# Patient Record
Sex: Male | Born: 1985
Health system: Southern US, Community
[De-identification: ages and names within clinical notes are randomized; demographics above are authoritative.]

## PROBLEM LIST (undated history)

## (undated) HISTORY — PX: APPENDECTOMY: SHX54

## (undated) HISTORY — PX: MOUTH SURGERY: SHX715

---

## 2013-08-05 ENCOUNTER — Encounter (INDEPENDENT_AMBULATORY_CARE_PROVIDER_SITE_OTHER): Payer: Self-pay

## 2013-08-05 ENCOUNTER — Encounter: Payer: Self-pay | Admitting: Family Medicine

## 2013-08-05 ENCOUNTER — Ambulatory Visit (INDEPENDENT_AMBULATORY_CARE_PROVIDER_SITE_OTHER): Payer: 59 | Admitting: Family Medicine

## 2013-08-05 VITALS — BP 132/85 | HR 49 | Temp 98.0°F | Ht 74.0 in | Wt 259.0 lb

## 2013-08-05 DIAGNOSIS — J3489 Other specified disorders of nose and nasal sinuses: Secondary | ICD-10-CM

## 2013-08-05 DIAGNOSIS — R509 Fever, unspecified: Secondary | ICD-10-CM

## 2013-08-05 DIAGNOSIS — J069 Acute upper respiratory infection, unspecified: Secondary | ICD-10-CM

## 2013-08-05 DIAGNOSIS — R0981 Nasal congestion: Secondary | ICD-10-CM

## 2013-08-05 DIAGNOSIS — R05 Cough: Secondary | ICD-10-CM

## 2013-08-05 DIAGNOSIS — R059 Cough, unspecified: Secondary | ICD-10-CM

## 2013-08-05 LAB — POCT INFLUENZA A/B
Influenza A, POC: NEGATIVE
Influenza B, POC: NEGATIVE

## 2013-08-05 MED ORDER — TRIAMCINOLONE ACETONIDE 40 MG/ML IJ SUSP: 60.0000 mg | Freq: Once | INTRAMUSCULAR | Status: DC

## 2013-08-05 MED ORDER — AZITHROMYCIN 250 MG PO TABS: ORAL_TABLET | ORAL | Status: DC

## 2013-08-05 NOTE — Progress Notes (Signed)
   Subjective:    Patient ID: Quanta Roher, male    DOB: Jun 15, 1985, 28 y.o.   MRN: [id number]  HPI  This 28 y.o. male presents for evaluation of URI for a week.  Review of Systems No chest pain, SOB, HA, dizziness, vision change, N/V, diarrhea, constipation, dysuria, urinary urgency or frequency, myalgias, arthralgias or rash.     Objective:   Physical Exam Vital signs noted  Well developed well nourished male.  HEENT - Head atraumatic Normocephalic                Eyes - PERRLA, Conjuctiva - clear Sclera- Clear EOMI                Ears - EAC's Wnl TM's Wnl Gross Hearing WNL                Nose - Nares patent                 Throat - oropharanx wnl Respiratory - Lungs CTA bilateral Cardiac - RRR S1 and S2 without murmur GI - Abdomen soft Nontender and bowel sounds active x 4 Extremities - No edema. Neuro - Grossly intact.       Assessment & Plan:  Cough - Plan: POCT Influenza A/B  Nasal congestion - Plan: POCT Influenza A/B  Fever - Plan: POCT Influenza A/B  URI (upper respiratory infection) - Plan: azithromycin (ZITHROMAX) 250 MG tablet, triamcinolone acetonide (KENALOG-40) injection 60 mg  Push po fluids, rest, tylenol and motrin otc prn as directed for fever, arthralgias, and myalgias.  Follow up prn if sx's continue or persist.  Lysbeth Penner FNP

## 2013-09-30 ENCOUNTER — Ambulatory Visit (INDEPENDENT_AMBULATORY_CARE_PROVIDER_SITE_OTHER): Payer: 59 | Admitting: Nurse Practitioner

## 2013-09-30 VITALS — BP 123/77 | HR 53 | Temp 97.3°F | Ht 75.0 in | Wt 254.0 lb

## 2013-09-30 DIAGNOSIS — J309 Allergic rhinitis, unspecified: Secondary | ICD-10-CM

## 2013-09-30 DIAGNOSIS — J029 Acute pharyngitis, unspecified: Secondary | ICD-10-CM

## 2013-09-30 DIAGNOSIS — J019 Acute sinusitis, unspecified: Secondary | ICD-10-CM

## 2013-09-30 LAB — POCT RAPID STREP A (OFFICE): Rapid Strep A Screen: NEGATIVE

## 2013-09-30 MED ORDER — AZITHROMYCIN 250 MG PO TABS: ORAL_TABLET | ORAL | Status: DC

## 2013-09-30 MED ORDER — CICLESONIDE 50 MCG/ACT NA SUSP: 2.0000 | Freq: Every day | NASAL | Status: DC

## 2013-09-30 NOTE — Patient Instructions (Signed)
Allergic Rhinitis Allergic rhinitis is when the mucous membranes in the nose respond to allergens. Allergens are particles in the air that cause your body to have an allergic reaction. This causes you to release allergic antibodies. Through a chain of events, these eventually cause you to release histamine into the blood stream. Although meant to protect the body, it is this release of histamine that causes your discomfort, such as frequent sneezing, congestion, and an itchy, runny nose.  CAUSES  Seasonal allergic rhinitis (hay fever) is caused by pollen allergens that may come from grasses, trees, and weeds. Year-round allergic rhinitis (perennial allergic rhinitis) is caused by allergens such as house dust mites, pet dander, and mold spores.  SYMPTOMS   Nasal stuffiness (congestion).  Itchy, runny nose with sneezing and tearing of the eyes. DIAGNOSIS  Your health care provider can help you determine the allergen or allergens that trigger your symptoms. If you and your health care provider are unable to determine the allergen, skin or blood testing may be used. TREATMENT  Allergic Rhinitis does not have a cure, but it can be controlled by:  Medicines and allergy shots (immunotherapy).  Avoiding the allergen. Hay fever may often be treated with antihistamines in pill or nasal spray forms. Antihistamines block the effects of histamine. There are over-the-counter medicines that may help with nasal congestion and swelling around the eyes. Check with your health care provider before taking or giving this medicine.  If avoiding the allergen or the medicine prescribed do not work, there are many new medicines your health care provider can prescribe. Stronger medicine may be used if initial measures are ineffective. Desensitizing injections can be used if medicine and avoidance does not work. Desensitization is when a patient is given ongoing shots until the body becomes less sensitive to the allergen.  Make sure you follow up with your health care provider if problems continue. HOME CARE INSTRUCTIONS It is not possible to completely avoid allergens, but you can reduce your symptoms by taking steps to limit your exposure to them. It helps to know exactly what you are allergic to so that you can avoid your specific triggers. SEEK MEDICAL CARE IF:   You have a fever.  You develop a cough that does not stop easily (persistent).  You have shortness of breath.  You start wheezing.  Symptoms interfere with normal daily activities. Document Released: 02/07/2001 Document Revised: 03/05/2013 Document Reviewed: 01/20/2013 [employer] Patient Information 2014 [employer].  

## 2013-09-30 NOTE — Progress Notes (Signed)
   Subjective:    Patient ID: Brett Aguirre, male    DOB: August 04, 1985, 28 y.o.   MRN: [id number]  HPI Patient in today c/o sore throat- started this morning- was a little scratchy last night- Does have nasal congestion and drainage.    Review of Systems  Constitutional: Negative for fever and chills.  HENT: Positive for congestion, postnasal drip, rhinorrhea and sore throat. Negative for trouble swallowing and voice change.   Respiratory: Negative.  Negative for cough.   Cardiovascular: Negative.   Genitourinary: Negative.   Psychiatric/Behavioral: Negative.   All other systems reviewed and are negative.      Objective:   Physical Exam  Constitutional: He is oriented to person, place, and time. He appears well-developed and well-nourished.  HENT:  Right Ear: Hearing, tympanic membrane, external ear and ear canal normal.  Left Ear: Hearing, tympanic membrane, external ear and ear canal normal.  Nose: Mucosal edema and rhinorrhea present. Right sinus exhibits maxillary sinus tenderness. Right sinus exhibits no frontal sinus tenderness. Left sinus exhibits maxillary sinus tenderness. Left sinus exhibits no frontal sinus tenderness.  Mouth/Throat: Posterior oropharyngeal erythema (mild) present.  Eyes: Pupils are equal, round, and reactive to light.  Neck: Normal range of motion. Neck supple.  Cardiovascular: Normal rate, regular rhythm and normal heart sounds.   Pulmonary/Chest: Effort normal and breath sounds normal. No respiratory distress. He has no wheezes. He has no rales. He exhibits no tenderness.  Abdominal: Soft.  Lymphadenopathy:    He has no cervical adenopathy.  Neurological: He is alert and oriented to person, place, and time.  Skin: Skin is warm.  Psychiatric: He has a normal mood and affect. His behavior is normal. Judgment and thought content normal.   BP 123/77  Pulse 53  Temp(Src) 97.3 F (36.3 C) (Oral)  Ht 6\' 3"  (1.905 m)  Wt 254 lb (115.214 kg)  BMI 31.75  kg/m2         Assessment & Plan:  1. Sore throat - POCT rapid strep A  2. Acute pharyngitis Force fluids Throat lozgenses  3. Acute rhinosinusitis 1. Take meds as prescribed 2. Use a cool mist humidifier especially during the winter months and when heat has been humid. 3. Use saline nose sprays frequently 4. Saline irrigations of the nose can be very helpful if done frequently.  * 4X daily for 1 week*  * Use of a nettie pot can be helpful with this. Follow directions with this* 5. Drink plenty of fluids 6. Keep thermostat turn down low 7.For any cough or congestion  Use plain Mucinex- regular strength or max strength is fine   * Children- consult with Pharmacist for dosing 8. For fever or aces or pains- take tylenol or ibuprofen appropriate for age and weight.  * for fevers greater than 101 orally you may alternate ibuprofen and tylenol every  3 hours.    - azithromycin (ZITHROMAX Z-PAK) 250 MG tablet; As directed  Dispense: 6 each; Refill: 0  4. Allergic rhinitis - ciclesonide (OMNARIS) 50 MCG/ACT nasal spray; Place 2 sprays into both nostrils daily.  Dispense: 12.5 g; Refill: 5   Mary-Margaret Hassell Done, FNP

## 2013-10-02 ENCOUNTER — Telehealth: Payer: Self-pay | Admitting: *Deleted

## 2013-10-02 NOTE — Telephone Encounter (Signed)
Has to try flonase OTC before ins will pay for omnaris

## 2013-10-02 NOTE — Telephone Encounter (Signed)
Needs to try fluticasone or nasonex first.before ins will cover omnaris.

## 2013-10-03 ENCOUNTER — Telehealth: Payer: Self-pay | Admitting: Nurse Practitioner

## 2013-10-03 NOTE — Telephone Encounter (Signed)
In another encounter 

## 2013-10-06 ENCOUNTER — Telehealth: Payer: Self-pay | Admitting: Nurse Practitioner

## 2013-10-06 NOTE — Telephone Encounter (Signed)
Aware , does not get a refill on antibiotic. Wait for medication to work.

## 2013-10-06 NOTE — Telephone Encounter (Signed)
We do not do refills on antibiotics- z pak u take for 5 days but stays in your system for 10 days.

## 2013-10-28 ENCOUNTER — Telehealth: Payer: Self-pay | Admitting: Nurse Practitioner

## 2013-10-28 ENCOUNTER — Encounter: Payer: Self-pay | Admitting: Nurse Practitioner

## 2013-10-28 ENCOUNTER — Ambulatory Visit (INDEPENDENT_AMBULATORY_CARE_PROVIDER_SITE_OTHER): Payer: 59 | Admitting: Nurse Practitioner

## 2013-10-28 VITALS — BP 129/85 | HR 83 | Temp 98.7°F | Ht 75.0 in | Wt 252.0 lb

## 2013-10-28 DIAGNOSIS — B9689 Other specified bacterial agents as the cause of diseases classified elsewhere: Secondary | ICD-10-CM

## 2013-10-28 DIAGNOSIS — J019 Acute sinusitis, unspecified: Secondary | ICD-10-CM

## 2013-10-28 MED ORDER — AZITHROMYCIN 250 MG PO TABS: ORAL_TABLET | ORAL | Status: DC

## 2013-10-28 MED ORDER — CHLORPHEN-PE-ACETAMINOPHEN 4-10-325 MG PO TABS: 1.0000 | ORAL_TABLET | Freq: Two times a day (BID) | ORAL | Status: DC

## 2013-10-28 MED ORDER — METHYLPREDNISOLONE ACETATE 80 MG/ML IJ SUSP
80.0000 mg | Freq: Once | INTRAMUSCULAR | Status: AC
  Administered 2013-10-28: 80 mg via INTRAMUSCULAR

## 2013-10-28 NOTE — Progress Notes (Signed)
   Subjective:    Patient ID: Brett Aguirre, male    DOB: 05/12/1986, 28 y.o.   MRN: [id number]  HPI atient in today c/o congestion and body aches- Chills then gets hot- green phlegm    Review of Systems  Constitutional: Positive for chills and fatigue.  HENT: Positive for congestion and rhinorrhea.   Respiratory: Negative for cough.   Cardiovascular: Negative.   Gastrointestinal: Negative.   Genitourinary: Negative.   Neurological: Negative.   Psychiatric/Behavioral: Negative.        Objective:   Physical Exam  Constitutional: He is oriented to person, place, and time. He appears well-developed and well-nourished. No distress.  HENT:  Right Ear: Hearing, tympanic membrane, external ear and ear canal normal.  Left Ear: Hearing, tympanic membrane, external ear and ear canal normal.  Nose: Mucosal edema and rhinorrhea present. Right sinus exhibits maxillary sinus tenderness and frontal sinus tenderness. Left sinus exhibits maxillary sinus tenderness and frontal sinus tenderness.  Mouth/Throat: Uvula is midline, oropharynx is clear and moist and mucous membranes are normal.  Eyes: Pupils are equal, round, and reactive to light.  Neck: Normal range of motion. Neck supple.  Cardiovascular: Normal rate, regular rhythm and normal heart sounds.   Pulmonary/Chest: Effort normal and breath sounds normal.  Lymphadenopathy:    He has no cervical adenopathy.  Neurological: He is alert and oriented to person, place, and time.  Skin: Skin is warm.  Face flushed  BP 129/85  Pulse 83  Temp(Src) 98.7 F (37.1 C) (Oral)  Ht 6\' 3"  (1.905 m)  Wt 252 lb (114.306 kg)  BMI 31.50 kg/m2         Assessment & Plan:   1. Acute bacterial rhinosinusitis    Meds ordered this encounter  Medications  . azithromycin (ZITHROMAX Z-PAK) 250 MG tablet    Sig: As directed    Dispense:  6 each    Refill:  0    Order Specific Question:  Supervising Provider    Answer:  Chipper Herb [[id number]]  .  methylPREDNISolone acetate (DEPO-MEDROL) injection 80 mg    Sig:   . Chlorphen-PE-Acetaminophen 4-10-325 MG TABS    Sig: Take 1 each by mouth 2 (two) times daily.    Dispense:  20 tablet    Refill:  0    Order Specific Question:  Supervising Provider    Answer:  Chipper Herb [[id number]]   1. Take meds as prescribed 2. Use a cool mist humidifier especially during the winter months and when heat has been humid. 3. Use saline nose sprays frequently 4. Saline irrigations of the nose can be very helpful if done frequently.  * 4X daily for 1 week*  * Use of a nettie pot can be helpful with this. Follow directions with this* 5. Drink plenty of fluids 6. Keep thermostat turn down low 7.For any cough or congestion  Use plain Mucinex- regular strength or max strength is fine   * Children- consult with Pharmacist for dosing 8. For fever or aces or pains- take tylenol or ibuprofen appropriate for age and weight.  * for fevers greater than 101 orally you may alternate ibuprofen and tylenol every  3 hours.   Mary-Margaret Hassell Done, FNP

## 2013-10-28 NOTE — Telephone Encounter (Signed)
appt at 4:30 with [facility]

## 2013-10-28 NOTE — Patient Instructions (Signed)

## 2014-05-20 ENCOUNTER — Telehealth: Payer: Self-pay | Admitting: Nurse Practitioner

## 2014-05-20 NOTE — Telephone Encounter (Signed)
No prior  notes or medications for cold sores.

## 2014-05-21 MED ORDER — VALACYCLOVIR HCL 1 G PO TABS: ORAL_TABLET | ORAL | Status: DC

## 2014-05-21 NOTE — Telephone Encounter (Signed)
Valtrex sent to pharmacy- patient aware

## 2014-12-08 ENCOUNTER — Ambulatory Visit (INDEPENDENT_AMBULATORY_CARE_PROVIDER_SITE_OTHER): Payer: 59 | Admitting: Family

## 2014-12-08 ENCOUNTER — Encounter: Payer: Self-pay | Admitting: Family

## 2014-12-08 ENCOUNTER — Encounter (INDEPENDENT_AMBULATORY_CARE_PROVIDER_SITE_OTHER): Payer: Self-pay

## 2014-12-08 VITALS — BP 131/88 | HR 72 | Temp 97.3°F | Ht 75.0 in | Wt 255.0 lb

## 2014-12-08 DIAGNOSIS — L259 Unspecified contact dermatitis, unspecified cause: Secondary | ICD-10-CM

## 2014-12-08 MED ORDER — TRIAMCINOLONE ACETONIDE 0.025 % EX OINT: 1.0000 "application " | TOPICAL_OINTMENT | Freq: Two times a day (BID) | CUTANEOUS | Status: DC

## 2014-12-08 MED ORDER — METHYLPREDNISOLONE ACETATE 80 MG/ML IJ SUSP
80.0000 mg | Freq: Once | INTRAMUSCULAR | Status: AC
  Administered 2014-12-08: 80 mg via INTRAMUSCULAR

## 2014-12-08 NOTE — Progress Notes (Signed)
   Subjective:    Patient ID: Brett Aguirre, male    DOB: [birth year], 29 y.o.   MRN: [id number]  Rash This is a new problem. The current episode started yesterday. The problem has been waxing and waning since onset. The affected locations include the groin and genitalia. The rash is characterized by itchiness and burning. He was exposed to plant contact. Pertinent negatives include no congestion, cough, diarrhea, fever, joint pain, shortness of breath or sore throat. Past treatments include anti-itch cream. The treatment provided no relief.      Review of Systems  Constitutional: Negative.  Negative for fever.  HENT: Negative.  Negative for congestion and sore throat.   Respiratory: Negative.  Negative for cough and shortness of breath.   Cardiovascular: Negative.   Gastrointestinal: Negative.  Negative for diarrhea.  Endocrine: Negative.   Genitourinary: Negative.   Musculoskeletal: Negative.  Negative for joint pain.  Skin: Positive for rash.  Neurological: Negative.   Hematological: Negative.   Psychiatric/Behavioral: Negative.   All other systems reviewed and are negative.      Objective:   Physical Exam  Constitutional: He is oriented to person, place, and time. He appears well-developed and well-nourished. No distress.  HENT:  Head: Normocephalic.  Eyes: Pupils are equal, round, and reactive to light. Right eye exhibits no discharge. Left eye exhibits no discharge.  Neck: Normal range of motion. Neck supple. No thyromegaly present.  Cardiovascular: Normal rate, regular rhythm, normal heart sounds and intact distal pulses.   No murmur heard. Pulmonary/Chest: Effort normal and breath sounds normal. No respiratory distress. He has no wheezes.  Abdominal: Soft. Bowel sounds are normal. He exhibits no distension. There is no tenderness.  Musculoskeletal: Normal range of motion. He exhibits no edema or tenderness.  Neurological: He is alert and oriented to person, place, and  time. He has normal reflexes. No cranial nerve deficit.  Skin: Skin is warm and dry. Rash (groin and scrotum) noted. No erythema.  Psychiatric: He has a normal mood and affect. His behavior is normal. Judgment and thought content normal.  Vitals reviewed.   BP 131/88 mmHg  Pulse 72  Temp(Src) 97.3 F (36.3 C) (Oral)  Ht 6\' 3"  (1.905 m)  Wt 255 lb (115.667 kg)  BMI 31.87 kg/m2      Assessment & Plan:  1. Contact dermatitis -Do scratch -Wear protective clothing while outside -RTO prn - triamcinolone (KENALOG) 0.025 % ointment; Apply 1 application topically 2 (two) times daily.  Dispense: 30 g; Refill: 0 - methylPREDNISolone acetate (DEPO-MEDROL) injection 80 mg; Inject 1 mL (80 mg total) into the muscle once.  Evelina Dun, FNP

## 2014-12-08 NOTE — Patient Instructions (Signed)

## 2014-12-15 ENCOUNTER — Ambulatory Visit (INDEPENDENT_AMBULATORY_CARE_PROVIDER_SITE_OTHER): Payer: 59 | Admitting: Family

## 2014-12-15 ENCOUNTER — Encounter: Payer: Self-pay | Admitting: Family

## 2014-12-15 VITALS — BP 119/76 | HR 45 | Temp 97.4°F | Ht 75.0 in | Wt 257.8 lb

## 2014-12-15 DIAGNOSIS — R197 Diarrhea, unspecified: Secondary | ICD-10-CM | POA: Diagnosis not present

## 2014-12-15 LAB — POCT CBC
GRANULOCYTE PERCENT: 59.9 % (ref 37–80)
HCT, POC: 46.3 % (ref 43.5–53.7)
Hemoglobin: 15.7 g/dL (ref 14.1–18.1)
LYMPH, POC: 3.1 (ref 0.6–3.4)
MCH: 29.4 pg (ref 27–31.2)
MCHC: 33.9 g/dL (ref 31.8–35.4)
MCV: 86.7 fL (ref 80–97)
MPV: 7.4 fL (ref 0–99.8)
PLATELET COUNT, POC: 275 10*3/uL (ref 142–424)
POC GRANULOCYTE: 5.5 (ref 2–6.9)
POC LYMPH PERCENT: 34.3 %L (ref 10–50)
RBC: 5.34 M/uL (ref 4.69–6.13)
RDW, POC: 12.8 %
WBC: 9.1 10*3/uL (ref 4.6–10.2)

## 2014-12-15 NOTE — Progress Notes (Signed)
   Subjective:    Patient ID: Brett Aguirre, male    DOB: 1986/01/05, 29 y.o.   MRN: [id number]  Diarrhea  This is a new problem. The current episode started in the past 7 days (Friday). The problem occurs 5 to 10 times per day. The problem has been unchanged. The stool consistency is described as watery. The patient states that diarrhea awakens him from sleep. Associated symptoms include increased flatus. Pertinent negatives include no bloating, chills, coughing, fever, headaches, myalgias, sweats or vomiting. There are no known risk factors. He has tried nothing for the symptoms. The treatment provided no relief.   *Pt has history of C diff- Pt states about 5 years ago.   Review of Systems  Constitutional: Negative.  Negative for fever and chills.  HENT: Negative.   Respiratory: Negative.  Negative for cough.   Cardiovascular: Negative.   Gastrointestinal: Positive for diarrhea and flatus. Negative for vomiting and bloating.  Endocrine: Negative.   Genitourinary: Negative.   Musculoskeletal: Negative.  Negative for myalgias.  Neurological: Negative.  Negative for headaches.  Hematological: Negative.   Psychiatric/Behavioral: Negative.   All other systems reviewed and are negative.      Objective:   Physical Exam  Constitutional: He is oriented to person, place, and time. He appears well-developed and well-nourished. No distress.  HENT:  Head: Normocephalic.  Right Ear: External ear normal.  Left Ear: External ear normal.  Nose: Nose normal.  Mouth/Throat: Oropharynx is clear and moist.  Eyes: Pupils are equal, round, and reactive to light. Right eye exhibits no discharge. Left eye exhibits no discharge.  Neck: Normal range of motion. Neck supple. No thyromegaly present.  Cardiovascular: Normal rate, regular rhythm, normal heart sounds and intact distal pulses.   No murmur heard. Pulmonary/Chest: Effort normal and breath sounds normal. No respiratory distress. He has no  wheezes.  Abdominal: Soft. Bowel sounds are normal. He exhibits no distension. There is no tenderness.  Musculoskeletal: Normal range of motion. He exhibits no edema or tenderness.  Neurological: He is alert and oriented to person, place, and time. He has normal reflexes. No cranial nerve deficit.  Skin: Skin is warm and dry. No rash noted. No erythema.  Psychiatric: He has a normal mood and affect. His behavior is normal. Judgment and thought content normal.  Vitals reviewed.    BP 119/76 mmHg  Pulse 45  Temp(Src) 97.4 F (36.3 C) (Oral)  Ht 6\' 3"  (1.905 m)  Wt 257 lb 12.8 oz (116.937 kg)  BMI 32.22 kg/m2      Assessment & Plan:  1. Diarrhea -Force fluids -Bring in stool sample -After sample- Imodium as needed -RTO prn - Cdiff NAA+O+P+Stool Culture - POCT CBC  Evelina Dun, FNP

## 2014-12-15 NOTE — Patient Instructions (Signed)

## 2014-12-16 ENCOUNTER — Other Ambulatory Visit: Payer: 59

## 2014-12-16 ENCOUNTER — Telehealth: Payer: Self-pay | Admitting: *Deleted

## 2014-12-16 NOTE — Telephone Encounter (Signed)
-----   Message from Sharion Balloon, [place] sent at 12/15/2014  4:33 PM EDT ----- CBC- WNL

## 2014-12-16 NOTE — Telephone Encounter (Signed)
Normal lab result

## 2014-12-21 LAB — CDIFF NAA+O+P+STOOL CULTURE
E coli, Shiga toxin Assay: NEGATIVE
Toxigenic C. Difficile by PCR: NEGATIVE

## 2015-06-09 ENCOUNTER — Ambulatory Visit (INDEPENDENT_AMBULATORY_CARE_PROVIDER_SITE_OTHER): Payer: 59 | Admitting: Family Medicine

## 2015-06-09 ENCOUNTER — Encounter: Payer: Self-pay | Admitting: Family Medicine

## 2015-06-09 VITALS — BP 124/67 | HR 62 | Temp 97.0°F | Ht 75.0 in | Wt 258.0 lb

## 2015-06-09 DIAGNOSIS — H6123 Impacted cerumen, bilateral: Secondary | ICD-10-CM | POA: Diagnosis not present

## 2015-06-09 DIAGNOSIS — J069 Acute upper respiratory infection, unspecified: Secondary | ICD-10-CM | POA: Diagnosis not present

## 2015-06-09 NOTE — Patient Instructions (Signed)
Earwax removal:  Debrox drops are available without a prescription at your pharmacy.  Lay on your side with the ear up that you want to treat. Place for 5 drops of the Debrox in the ear canal and lay still for 15 minutes. After that time you considered up and allow the excess to run out of the year and catch it with a Kleenex. Repeat this with the other ear if needed.  Repeat this process daily for 1 week. By that time the ear should feel less clogged and her hearing should be better, if not, follow up in the office for recheck of the ear.  To prevent future problems, try using the drops once a week for maintenance.  Thanks, [employer]

## 2015-06-09 NOTE — Progress Notes (Signed)
Subjective:  Patient ID: Brett Aguirre, male    DOB: Sep 18, 1985  Age: 30 y.o. MRN: [id number]  CC: Cerumen Impaction   HPI Brett Aguirre presents for ears stopped up. Had a cold starting 2 weeks ago.Has been cleaning ears with a q tip but just around the opening  History Brett Aguirre has no past medical history on file.   He has past surgical history that includes Appendectomy and Mouth surgery.   His family history includes Healthy in his brother and mother; Hyperlipidemia in his father.He reports that he has never smoked. He does not have any smokeless tobacco history on file. He reports that he drinks alcohol. He reports that he does not use illicit drugs.  No current Medications.  ROS Review of Systems  Constitutional: Negative for fever, chills, activity change and appetite change.  HENT: Positive for congestion, ear discharge (flecks of wax;) and ear pain. Negative for hearing loss, nosebleeds, postnasal drip, rhinorrhea, sinus pressure, sneezing and trouble swallowing.   Respiratory: Negative for cough, chest tightness and shortness of breath.   Cardiovascular: Negative for chest pain and palpitations.  Skin: Negative for rash.    Objective:  BP 124/67 mmHg  Pulse 62  Temp(Src) 97 F (36.1 C) (Oral)  Ht 6\' 3"  (1.905 m)  Wt 258 lb (117.028 kg)  BMI 32.25 kg/m2  SpO2 97%  Physical Exam  Constitutional: He appears well-developed and well-nourished.  HENT:  Head: Normocephalic and atraumatic.  Right Ear: External ear normal. Tympanic membrane is not injected, not perforated and not erythematous. No decreased hearing is noted.  Left Ear: Tympanic membrane and external ear normal. Tympanic membrane is not injected and not perforated. No decreased hearing is noted.  Nose: No mucosal edema or rhinorrhea. Right sinus exhibits no frontal sinus tenderness. Left sinus exhibits no frontal sinus tenderness.  Mouth/Throat: No oropharyngeal exudate or posterior oropharyngeal  erythema.  Canals occluded with cerumen. Lavage performed with resolution of impaction allowing inspection of TMs which are normal.  Neck: No Brudzinski's sign noted.  Pulmonary/Chest: Breath sounds normal. No respiratory distress.  Lymphadenopathy:       Head (right side): No preauricular adenopathy present.       Head (left side): No preauricular adenopathy present.       Right cervical: No superficial cervical adenopathy present.      Left cervical: No superficial cervical adenopathy present.    Assessment & Plan:   Brett Aguirre was seen today for cerumen impaction.  Diagnoses and all orders for this visit:  Cerumen impaction, bilateral -     Ear wax removal  URI (upper respiratory infection)  I have discontinued Mr. Storie's triamcinolone. I am also having him maintain his valACYclovir.  No orders of the defined types were placed in this encounter.   Earwax removal:  Debrox drops are available without a prescription at your pharmacy.  Lay on your side with the ear up that you want to treat. Place for 5 drops of the Debrox in the ear canal and lay still for 15 minutes. After that time you considered up and allow the excess to run out of the year and catch it with a Kleenex. Repeat this with the other ear if needed.  Repeat this process daily for 1 week. By that time the ear should feel less clogged and her hearing should be better, if not, follow up in the office for recheck of the ear.  To prevent future problems, try using the drops once a week for maintenance.  Follow-up: No Follow-up on file.  Claretta Fraise, M.D.

## 2015-08-23 ENCOUNTER — Encounter: Payer: Self-pay | Admitting: Family Medicine

## 2015-08-23 ENCOUNTER — Ambulatory Visit (INDEPENDENT_AMBULATORY_CARE_PROVIDER_SITE_OTHER): Payer: 59 | Admitting: Family Medicine

## 2015-08-23 VITALS — BP 127/74 | HR 82 | Temp 97.4°F | Ht 75.0 in | Wt 255.0 lb

## 2015-08-23 DIAGNOSIS — J019 Acute sinusitis, unspecified: Secondary | ICD-10-CM | POA: Diagnosis not present

## 2015-08-23 MED ORDER — CICLESONIDE 50 MCG/ACT NA SUSP: 2.0000 | Freq: Every day | NASAL | Status: DC

## 2015-08-23 MED ORDER — AZITHROMYCIN 250 MG PO TABS: ORAL_TABLET | ORAL | Status: DC

## 2015-08-23 NOTE — Progress Notes (Signed)
BP 127/74 mmHg  Pulse 82  Temp(Src) 97.4 F (36.3 C) (Oral)  Ht 6\' 3"  (1.905 m)  Wt 255 lb (115.667 kg)  BMI 31.87 kg/m2   Subjective:    Patient ID: Brett Aguirre, male    DOB: February 20, 1986, 30 y.o.   MRN: [id number]  HPI: Brett Aguirre is a 30 y.o. male presenting on 08/23/2015 for Nasal Congestion   HPI Sinus congestion and nasal congestion Patient has been having sinus congestion and nasal congestion and postnasal drainage that is been going on for the past 3 days. He denies any fevers or chills or shortness of breath or wheezing. He does have a nasally voice and swelling in the back of his throat. 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 He did try starting Mucinex and Tylenol Sinus and cold. They have not helped significantly.  Relevant past medical, surgical, family and social history reviewed and updated as indicated. Interim medical history since our last visit reviewed. Allergies and medications reviewed and updated.  Review of Systems  Constitutional: Negative for fever and chills.  HENT: Positive for congestion, postnasal drip, rhinorrhea, sinus pressure, sneezing and sore throat. Negative for ear discharge, ear pain and voice change.   Eyes: Negative for pain, discharge, redness and visual disturbance.  Respiratory: Positive for cough. Negative for shortness of breath and wheezing.   Cardiovascular: Negative for chest pain and leg swelling.  Gastrointestinal: Negative for abdominal pain, diarrhea and constipation.  Genitourinary: Negative for difficulty urinating.  Musculoskeletal: Negative for back pain and gait problem.  Skin: Negative for rash.  Neurological: Negative for syncope, light-headedness and headaches.  All other systems reviewed and are negative.   Per HPI unless specifically indicated above     Medication List       This list is accurate as of: 08/23/15   6:52 PM.  Always use your most recent med list.               azithromycin 250 MG tablet  Commonly known as:  ZITHROMAX  Take 2 the first day and then one each day after.     ciclesonide 50 MCG/ACT nasal spray  Commonly known as:  OMNARIS  Place 2 sprays into both nostrils daily.           Objective:    BP 127/74 mmHg  Pulse 82  Temp(Src) 97.4 F (36.3 C) (Oral)  Ht 6\' 3"  (1.905 m)  Wt 255 lb (115.667 kg)  BMI 31.87 kg/m2  Wt Readings from Last 3 Encounters:  08/23/15 255 lb (115.667 kg)  06/09/15 258 lb (117.028 kg)  12/15/14 257 lb 12.8 oz (116.937 kg)    Physical Exam  Constitutional: He is oriented to person, place, and time. He appears well-developed and well-nourished. No distress.  HENT:  Right Ear: Tympanic membrane, external ear and ear canal normal.  Left Ear: Tympanic membrane, external ear and ear canal normal.  Nose: Mucosal edema and rhinorrhea present. No sinus tenderness. No epistaxis. Right sinus exhibits maxillary sinus tenderness. Right sinus exhibits no frontal sinus tenderness. Left sinus exhibits maxillary sinus tenderness. Left sinus exhibits no frontal sinus tenderness.  Mouth/Throat: Uvula is midline and mucous membranes are normal. Posterior oropharyngeal edema and posterior oropharyngeal erythema present. No oropharyngeal exudate or tonsillar abscesses.  Eyes: Conjunctivae and EOM are normal. Pupils are equal, round, and reactive to light. Right eye exhibits no discharge. No  scleral icterus.  Neck: Neck supple. No thyromegaly present.  Cardiovascular: Normal rate, regular rhythm, normal heart sounds and intact distal pulses.   No murmur heard. Pulmonary/Chest: Effort normal and breath sounds normal. No respiratory distress. He has no wheezes. He has no rales.  Musculoskeletal: Normal range of motion. He exhibits no edema.  Lymphadenopathy:    He has no cervical adenopathy.  Neurological: He is alert and oriented to person, place, and time.  Coordination normal.  Skin: Skin is warm and dry. No rash noted. He is not diaphoretic.  Psychiatric: He has a normal mood and affect. His behavior is normal.  Nursing note and vitals reviewed.     Assessment & Plan:   Problem List Items Addressed This Visit    None    Visit Diagnoses    Acute rhinosinusitis    -  Primary    Likely allergic, will try nasal steroid, antihistamine, he is intact, nasal saline. It does not improve pickup azithromycin    Relevant Medications    azithromycin (ZITHROMAX) 250 MG tablet    ciclesonide (OMNARIS) 50 MCG/ACT nasal spray       Follow up plan: Return if symptoms worsen or fail to improve.  Counseling provided for all of the vaccine components No orders of the defined types were placed in this encounter.    Caryl Pina, MD [facility] 08/23/2015, 6:52 PM

## 2015-09-29 ENCOUNTER — Telehealth: Payer: Self-pay | Admitting: Nurse Practitioner

## 2015-09-29 DIAGNOSIS — J019 Acute sinusitis, unspecified: Secondary | ICD-10-CM

## 2015-09-29 MED ORDER — AZITHROMYCIN 250 MG PO TABS: ORAL_TABLET | ORAL | Status: DC

## 2015-09-29 NOTE — Telephone Encounter (Signed)
error 

## 2015-09-29 NOTE — Telephone Encounter (Signed)
rx sent to pharmacy

## 2015-09-29 NOTE — Telephone Encounter (Signed)
Please advise 

## 2015-09-29 NOTE — Telephone Encounter (Signed)
Aware, script sent to pharmacy. 

## 2016-05-03 ENCOUNTER — Encounter: Payer: Self-pay | Admitting: Nurse Practitioner

## 2016-05-03 ENCOUNTER — Ambulatory Visit (INDEPENDENT_AMBULATORY_CARE_PROVIDER_SITE_OTHER): Payer: 59 | Admitting: Nurse Practitioner

## 2016-05-03 VITALS — BP 132/84 | HR 77 | Temp 97.3°F | Ht 75.0 in | Wt 262.6 lb

## 2016-05-03 DIAGNOSIS — J0101 Acute recurrent maxillary sinusitis: Secondary | ICD-10-CM | POA: Diagnosis not present

## 2016-05-03 MED ORDER — AZITHROMYCIN 250 MG PO TABS
ORAL_TABLET | ORAL | 0 refills | Status: DC
Start: 2016-05-03 — End: 2016-05-25

## 2016-05-03 NOTE — Progress Notes (Signed)
Subjective:     Brett Aguirre is a 30 y.o. male who presents for evaluation of sinus pain. Symptoms include: clear rhinorrhea, congestion, foul breath, headaches, nasal congestion, sinus pressure and tooth pain. Onset of symptoms was 1 day ago. Symptoms have been gradually worsening since that time. Past history is significant for occasional episodes of bronchitis. Patient is a non-smoker.  The following portions of the patient's history were reviewed and updated as appropriate: allergies, current medications, past family history, past medical history, past social history, past surgical history and problem list.  Review of Systems Pertinent items noted in HPI and remainder of comprehensive ROS otherwise negative.   Objective:    BP 132/84   Pulse 77   Temp 97.3 F (36.3 C) (Oral)   Ht 6\' 3"  (1.905 m)   Wt 262 lb 9.6 oz (119.1 kg)   BMI 32.82 kg/m  General appearance: alert, cooperative and mild distress Eyes: conjunctivae/corneas clear. PERRL, EOM's intact. Fundi benign. Ears: normal TM's and external ear canals both ears and cerumen impaction right ear Nose: blood tinged discharge, mild congestion, turbinates red, sinus tenderness bilateral Throat: lips, mucosa, and tongue normal; teeth and gums normal Neck: no adenopathy, no carotid bruit, no JVD, supple, symmetrical, trachea midline and thyroid not enlarged, symmetric, no tenderness/mass/nodules Lungs: clear to auscultation bilaterally Heart: regular rate and rhythm, S1, S2 normal, no murmur, click, rub or gallop    Assessment:    Acute bacterial sinusitis.    Plan:     1. Take meds as prescribed 2. Use a cool mist humidifier especially during the winter months and when heat has been humid. 3. Use saline nose sprays frequently 4. Saline irrigations of the nose can be very helpful if done frequently.  * 4X daily for 1 week*  * Use of a nettie pot can be helpful with this. Follow directions with this* 5. Drink plenty of  fluids 6. Keep thermostat turn down low 7.For any cough or congestion  Use plain Mucinex- regular strength or max strength is fine   * Children- consult with Pharmacist for dosing 8. For fever or aces or pains- take tylenol or ibuprofen appropriate for age and weight.  * for fevers greater than 101 orally you may alternate ibuprofen and tylenol every  3 hours.   Meds ordered this encounter  Medications  . fluticasone (FLONASE) 50 MCG/ACT nasal spray    Sig: Place 2 sprays into both nostrils daily.  Marland Kitchen azithromycin (ZITHROMAX) 250 MG tablet    Sig: Two tablets day one, then one tablet daily next 4 days.    Dispense:  6 tablet    Refill:  0    Order Specific Question:   Supervising Provider    Answer:   Eustaquio Maize [[id number]]   Mary-Margaret Hassell Done, FNP

## 2016-05-03 NOTE — Patient Instructions (Signed)

## 2016-05-25 ENCOUNTER — Telehealth: Payer: Self-pay | Admitting: Nurse Practitioner

## 2016-05-25 ENCOUNTER — Encounter: Payer: Self-pay | Admitting: Nurse Practitioner

## 2016-05-25 ENCOUNTER — Ambulatory Visit (INDEPENDENT_AMBULATORY_CARE_PROVIDER_SITE_OTHER): Payer: 59 | Admitting: Nurse Practitioner

## 2016-05-25 VITALS — BP 126/76 | HR 61 | Temp 97.3°F | Ht 75.0 in | Wt 264.0 lb

## 2016-05-25 DIAGNOSIS — J029 Acute pharyngitis, unspecified: Secondary | ICD-10-CM

## 2016-05-25 MED ORDER — AMOXICILLIN 875 MG PO TABS: 875.0000 mg | ORAL_TABLET | Freq: Two times a day (BID) | ORAL | 0 refills | Status: DC

## 2016-05-25 NOTE — Telephone Encounter (Signed)
Pt is having ear pain and sore throat appt scheduled

## 2016-05-25 NOTE — Progress Notes (Signed)
Subjective:     Brett Aguirre is a 30 y.o. male who presents for evaluation of sore throat. Associated symptoms include chills, headache, nasal blockage, post nasal drip, sinus and nasal congestion, sore throat and swollen glands. Onset of symptoms was 3 days ago, and have been gradually worsening since that time. He is drinking plenty of fluids. He has not had a recent close exposure to someone with proven streptococcal pharyngitis.  The following portions of the patient's history were reviewed and updated as appropriate: allergies, current medications, past family history, past medical history, past social history, past surgical history and problem list.  Review of Systems Pertinent items noted in HPI and remainder of comprehensive ROS otherwise negative.    Objective:    BP 126/76   Pulse 61   Temp 97.3 F (36.3 C) (Oral)   Ht 6\' 3"  (1.905 m)   Wt 264 lb (119.7 kg)   BMI 33.00 kg/m  General appearance: alert, cooperative and mild distress Eyes: conjunctivae/corneas clear. PERRL, EOM's intact. Fundi benign. Ears: normal TM's and external ear canals both ears Nose: clear discharge, moderate congestion, turbinates red Throat: abnormal findings: marked oropharyngeal erythema Neck: mild anterior cervical adenopathy, no adenopathy, no carotid bruit, no JVD, supple, symmetrical, trachea midline and thyroid not enlarged, symmetric, no tenderness/mass/nodules Lungs: clear to auscultation bilaterally Heart: regular rate and rhythm, S1, S2 normal, no murmur, click, rub or gallop  Laboratory Strep test not done.    Assessment:    Acute pharyngitis, likely  bacterial pharyngitis.    Plan:   Force fluids Motrin or tylenol OTC OTC decongestant Throat lozenges if help New toothbrush in 3 days   Meds ordered this encounter  Medications  . amoxicillin (AMOXIL) 875 MG tablet    Sig: Take 1 tablet (875 mg total) by mouth 2 (two) times daily. 1 po BID    Dispense:  20 tablet   Refill:  0    Order Specific Question:   Supervising Provider    Answer:   Eustaquio Maize [[id number]]   Mary-Margaret Hassell Done, FNP

## 2016-05-25 NOTE — Patient Instructions (Signed)
Sore Throat When you have a sore throat, your throat may:  Hurt.  Burn.  Feel irritated.  Feel scratchy. Many things can cause a sore throat, including:  An infection.  Allergies.  Dryness in the air.  Smoke or pollution.  Gastroesophageal reflux disease (GERD).  A tumor. A sore throat can be the first sign of another sickness. It can happen with other problems, like coughing or a fever. Most sore throats go away without treatment. Follow these instructions at home:  Take over-the-counter medicines only as told by your doctor.  Drink enough fluids to keep your pee (urine) clear or pale yellow.  Rest when you feel you need to.  To help with pain, try:  Sipping warm liquids, such as broth, herbal tea, or warm water.  Eating or drinking cold or frozen liquids, such as frozen ice pops.  Gargling with a salt-water mixture 3-4 times a day or as needed. To make a salt-water mixture, add -1 tsp of salt in 1 cup of warm water. Mix it until you cannot see the salt anymore.  Sucking on hard candy or throat lozenges.  Putting a cool-mist humidifier in your bedroom at night.  Sitting in the bathroom with the door closed for 5-10 minutes while you run hot water in the shower.  Do not use any tobacco products, such as cigarettes, chewing tobacco, and e-cigarettes. If you need help quitting, ask your doctor. Contact a doctor if:  You have a fever for more than 2-3 days.  You keep having symptoms for more than 2-3 days.  Your throat does not get better in 7 days.  You have a fever and your symptoms suddenly get worse. Get help right away if:  You have trouble breathing.  You cannot swallow fluids, soft foods, or your saliva.  You have swelling in your throat or neck that gets worse.  You keep feeling like you are going to throw up (vomit).  You keep throwing up. This information is not intended to replace advice given to you by your health care provider. Make sure  you discuss any questions you have with your health care provider. Document Released: 02/22/2008 Document Revised: 01/09/2016 Document Reviewed: 03/05/2015 Elsevier Interactive Patient Education  2017 [employer]  

## 2017-01-24 ENCOUNTER — Ambulatory Visit (INDEPENDENT_AMBULATORY_CARE_PROVIDER_SITE_OTHER): Payer: 59 | Admitting: Family Medicine

## 2017-01-24 ENCOUNTER — Encounter: Payer: Self-pay | Admitting: Family Medicine

## 2017-01-24 VITALS — BP 130/88 | HR 58 | Temp 97.8°F | Ht 75.0 in | Wt 257.0 lb

## 2017-01-24 DIAGNOSIS — G5622 Lesion of ulnar nerve, left upper limb: Secondary | ICD-10-CM | POA: Diagnosis not present

## 2017-01-24 MED ORDER — PREDNISONE 20 MG PO TABS: ORAL_TABLET | ORAL | 0 refills | Status: DC

## 2017-01-24 NOTE — Progress Notes (Signed)
   BP 130/88   Pulse (!) 58   Temp 97.8 F (36.6 C) (Oral)   Ht 6\' 3"  (1.905 m)   Wt 257 lb (116.6 kg)   BMI 32.12 kg/m    Subjective:    Patient ID: Brett Aguirre, male    DOB: Apr 22, 1986, 31 y.o.   MRN: [id number]  HPI: Brett Aguirre is a 31 y.o. male presenting on 01/24/2017 for Numbness and tingling in 4th and 5th digit of left hand (began about 6-8 weeks ago, no pain or discomfort)   HPI Left hand tingling and numbness Patient comes in complaining of left hand tingling and numbness that is mostly in his pinky finger but also sometimes in his ring finger. 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 He denies any fevers or chills. He does have numbness but denies any weakness. He describes it as a tingling sensation and numb.  Relevant past medical, surgical, family and social history reviewed and updated as indicated. Interim medical history since our last visit reviewed. Allergies and medications reviewed and updated.  Review of Systems  Constitutional: Negative for chills and fever.  Respiratory: Negative for shortness of breath and wheezing.   Cardiovascular: Negative for chest pain and leg swelling.  Musculoskeletal: Negative for arthralgias, back pain, gait problem, joint swelling and myalgias.  Skin: Negative for rash.  Neurological: Positive for numbness. Negative for weakness.  All other systems reviewed and are negative.   Per HPI unless specifically indicated above        Objective:    BP 130/88   Pulse (!) 58   Temp 97.8 F (36.6 C) (Oral)   Ht 6\' 3"  (1.905 m)   Wt 257 lb (116.6 kg)   BMI 32.12 kg/m   Wt Readings from Last 3 Encounters:  01/24/17 257 lb (116.6 kg)  05/25/16 264 lb (119.7 kg)  05/03/16 262 lb 9.6 oz (119.1 kg)    Physical Exam  Constitutional: He is oriented to person, place, and time.  He appears well-developed and well-nourished. No distress.  Eyes: Conjunctivae are normal. No scleral icterus.  Pulmonary/Chest: Breath sounds normal.  Musculoskeletal: Normal range of motion.  Neurological: He is alert and oriented to person, place, and time. Coordination normal.  Numbness and left pinky finger and some in left ring finger. No grip strength weakness on left hand. Able to elicit some tingling upon compression of left medial epicondyle  Skin: Skin is warm and dry. No rash noted. He is not diaphoretic.  Psychiatric: He has a normal mood and affect. His behavior is normal.  Nursing note and vitals reviewed.     Assessment & Plan:   Problem List Items Addressed This Visit    None    Visit Diagnoses    Ulnar neuropathy at elbow of left upper extremity    -  Primary   Relevant Medications   predniSONE (DELTASONE) 20 MG tablet       Follow up plan: Return if symptoms worsen or fail to improve.  Counseling provided for all of the vaccine components No orders of the defined types were placed in this encounter.   Caryl Pina, MD [facility] 01/24/2017, 10:33 AM

## 2017-06-05 ENCOUNTER — Ambulatory Visit (INDEPENDENT_AMBULATORY_CARE_PROVIDER_SITE_OTHER): Payer: 59 | Admitting: Nurse Practitioner

## 2017-06-05 ENCOUNTER — Encounter: Payer: Self-pay | Admitting: Nurse Practitioner

## 2017-06-05 VITALS — BP 120/76 | HR 61 | Temp 97.7°F | Ht 75.0 in | Wt 260.0 lb

## 2017-06-05 DIAGNOSIS — R002 Palpitations: Secondary | ICD-10-CM

## 2017-06-05 DIAGNOSIS — G4730 Sleep apnea, unspecified: Secondary | ICD-10-CM

## 2017-06-05 NOTE — Progress Notes (Signed)
   Subjective:    Patient ID: Brett Aguirre, male    DOB: 1985/09/03, 32 y.o.   MRN: [id number]  HPI Patient comes in today C/o of: - heart palpitations this past Sunday and lasted all day. No chest pain no sob. When he woke up the next morning it was gonee - wife thinks that he has sleep apnea- she says he snores and sounds like he stops breathing in his sleep.   Review of Systems  Constitutional: Negative.   HENT: Negative.   Respiratory: Negative.   Cardiovascular: Negative.   Gastrointestinal: Negative.   Genitourinary: Negative.   Neurological: Negative.   Psychiatric/Behavioral: Negative.   All other systems reviewed and are negative.      Objective:   Physical Exam  Constitutional: He is oriented to person, place, and time. He appears well-developed and well-nourished. No distress.  Cardiovascular: Normal rate and regular rhythm.  Pulmonary/Chest: Effort normal and breath sounds normal.  Neurological: He is alert and oriented to person, place, and time.  Skin: Skin is warm.  Psychiatric: He has a normal mood and affect. His behavior is normal. Judgment and thought content normal.   BP 120/76   Pulse 61   Temp 97.7 F (36.5 C) (Oral)   Ht 6\' 3"  (1.905 m)   Wt 260 lb (117.9 kg)   BMI 32.50 kg/m   EKG- NSR with rate Zettie Pho, FNP     Assessment & Plan:  1. Palpitations Avoid caffeien RTO fpr monitor if happens again - EKG 12-Lead  2. Sleep apnea, unspecified type Will call with appointment for sleep study - Ambulatory referral to Sleep Studies  [place], FNP

## 2017-06-05 NOTE — Patient Instructions (Signed)

## 2017-07-20 ENCOUNTER — Ambulatory Visit (INDEPENDENT_AMBULATORY_CARE_PROVIDER_SITE_OTHER): Payer: 59 | Admitting: Nurse Practitioner

## 2017-07-20 ENCOUNTER — Encounter: Payer: Self-pay | Admitting: Nurse Practitioner

## 2017-07-20 VITALS — BP 135/71 | HR 67 | Temp 97.3°F | Ht 75.0 in | Wt 264.0 lb

## 2017-07-20 DIAGNOSIS — J0100 Acute maxillary sinusitis, unspecified: Secondary | ICD-10-CM | POA: Diagnosis not present

## 2017-07-20 MED ORDER — AMOXICILLIN-POT CLAVULANATE 875-125 MG PO TABS: 1.0000 | ORAL_TABLET | Freq: Two times a day (BID) | ORAL | 0 refills | Status: DC

## 2017-07-20 NOTE — Patient Instructions (Signed)
1. Take meds as prescribed 2. Use a cool mist humidifier especially during the winter months and when heat has been humid. 3. Use saline nose sprays frequently 4. Saline irrigations of the nose can be very helpful if done frequently.  * 4X daily for 1 week*  * Use of a nettie pot can be helpful with this. Follow directions with this* 5. Drink plenty of fluids 6. Keep thermostat turn down low 7.For any cough or congestion  norel ad- samples given 8. For fever or aces or pains- take tylenol or ibuprofen appropriate for age and weight.  * for fevers greater than 101 orally you may alternate ibuprofen and tylenol every  3 hours.

## 2017-07-20 NOTE — Progress Notes (Signed)
   Subjective:    Patient ID: Brett Aguirre, male    DOB: 21-Apr-1986, 32 y.o.   MRN: [id number]  HPI Patient comes in today c/o cough and congestion. Started 2 weeks ago. Has had intermittent ear pain and sore throat. He has been using mucinex and claritin d and no better.    Review of Systems  Constitutional: Positive for appetite change (decreased). Negative for chills and fever.  HENT: Positive for congestion, ear pain, postnasal drip, rhinorrhea, sinus pain and sore throat.   Respiratory: Negative for cough and shortness of breath.   Cardiovascular: Negative.   Genitourinary: Negative.   Neurological: Positive for headaches.  Psychiatric/Behavioral: Negative.   All other systems reviewed and are negative.      Objective:   Physical Exam  Constitutional: He is oriented to person, place, and time. He appears well-developed and well-nourished. He appears distressed (mild).  HENT:  Right Ear: Hearing, tympanic membrane, external ear and ear canal normal.  Left Ear: Hearing, external ear and ear canal normal. A middle ear effusion (clesr) is present.  Nose: Mucosal edema and rhinorrhea present. Right sinus exhibits maxillary sinus tenderness. Right sinus exhibits no frontal sinus tenderness. Left sinus exhibits maxillary sinus tenderness. Left sinus exhibits no frontal sinus tenderness.  Mouth/Throat: Uvula is midline. Posterior oropharyngeal erythema (mild) present.  Neck: Normal range of motion. Neck supple.  Cardiovascular: Normal rate and regular rhythm.  Pulmonary/Chest: Effort normal and breath sounds normal.  Lymphadenopathy:    He has no cervical adenopathy.  Neurological: He is oriented to person, place, and time.  Skin: Skin is warm.  Psychiatric: He has a normal mood and affect. His behavior is normal. Judgment and thought content normal.   BP 135/71   Pulse 67   Temp (!) 97.3 F (36.3 C) (Oral)   Ht 6\' 3"  (1.905 m)   Wt 264 lb (119.7 kg)   BMI 33.00 kg/m         Assessment & Plan:  1. Acute maxillary sinusitis, recurrence not specified 1. Take meds as prescribed 2. Use a cool mist humidifier especially during the winter months and when heat has been humid. 3. Use saline nose sprays frequently 4. Saline irrigations of the nose can be very helpful if done frequently.  * 4X daily for 1 week*  * Use of a nettie pot can be helpful with this. Follow directions with this* 5. Drink plenty of fluids 6. Keep thermostat turn down low 7.For any cough or congestion  norel ad- samples 8. For fever or aces or pains- take tylenol or ibuprofen appropriate for age and weight.  * for fevers greater than 101 orally you may alternate ibuprofen and tylenol every  3 hours.    - amoxicillin-clavulanate (AUGMENTIN) 875-125 MG tablet; Take 1 tablet by mouth 2 (two) times daily.  Dispense: 14 tablet; Refill: 0  Mary-Margaret Hassell Done, FNP

## 2017-11-16 ENCOUNTER — Encounter: Payer: Self-pay | Admitting: Family

## 2017-11-16 ENCOUNTER — Ambulatory Visit (INDEPENDENT_AMBULATORY_CARE_PROVIDER_SITE_OTHER): Payer: 59 | Admitting: Family

## 2017-11-16 VITALS — BP 123/67 | HR 52 | Temp 97.4°F | Ht 75.0 in | Wt 261.2 lb

## 2017-11-16 DIAGNOSIS — B354 Tinea corporis: Secondary | ICD-10-CM

## 2017-11-16 MED ORDER — CLOTRIMAZOLE-BETAMETHASONE 1-0.05 % EX LOTN: TOPICAL_LOTION | Freq: Two times a day (BID) | CUTANEOUS | 0 refills | Status: DC

## 2017-11-16 NOTE — Progress Notes (Signed)
   Subjective:    Patient ID: Brett Aguirre, male    DOB: 1986-02-27, 32 y.o.   MRN: [id number]  Chief Complaint  Patient presents with  . red circles on left lower leg    Rash  This is a new problem. The current episode started 1 to 4 weeks ago. The problem is unchanged. The affected locations include the left lower leg. The rash is characterized by itchiness, redness and scaling. He was exposed to nothing. Treatments tried: bleach. The treatment provided mild relief.      Review of Systems  Skin: Positive for rash.  All other systems reviewed and are negative.      Objective:   Physical Exam  Constitutional: He is oriented to person, place, and time. He appears well-developed and well-nourished. No distress.  HENT:  Head: Normocephalic.  Eyes: Pupils are equal, round, and reactive to light. Right eye exhibits no discharge. Left eye exhibits no discharge.  Neck: Normal range of motion. Neck supple. No thyromegaly present.  Cardiovascular: Normal rate, regular rhythm, normal heart sounds and intact distal pulses.  No murmur heard. Pulmonary/Chest: Effort normal and breath sounds normal. No respiratory distress. He has no wheezes.  Abdominal: Soft. Bowel sounds are normal. He exhibits no distension. There is no tenderness.  Musculoskeletal: Normal range of motion. He exhibits no edema or tenderness.  Neurological: He is alert and oriented to person, place, and time. He has normal reflexes. No cranial nerve deficit.  Skin: Skin is warm and dry. Rash noted. No erythema.     Psychiatric: He has a normal mood and affect. His behavior is normal. Judgment and thought content normal.  Vitals reviewed.     BP 123/67   Pulse (!) 52   Temp (!) 97.4 F (36.3 C) (Oral)   Ht 6\' 3"  (1.905 m)   Wt 261 lb 3.2 oz (118.5 kg)   BMI 32.65 kg/m      Assessment & Plan:  [name] was seen today for red circles on left lower leg.  Diagnoses and all orders for this visit:  Tinea  corporis -     clotrimazole-betamethasone (LOTRISONE) lotion; Apply topically 2 (two) times daily.   Do not scratch Keep clean and dry Good hand hygiene discussed RTO if symptoms do not improve or worsen  Evelina Dun, FNP

## 2017-11-16 NOTE — Patient Instructions (Signed)

## 2017-12-05 ENCOUNTER — Ambulatory Visit (INDEPENDENT_AMBULATORY_CARE_PROVIDER_SITE_OTHER): Payer: 59 | Admitting: Pediatrics

## 2017-12-05 ENCOUNTER — Encounter: Payer: Self-pay | Admitting: Pediatrics

## 2017-12-05 VITALS — BP 129/86 | HR 84 | Temp 97.5°F | Ht 75.0 in | Wt 260.0 lb

## 2017-12-05 DIAGNOSIS — R197 Diarrhea, unspecified: Secondary | ICD-10-CM | POA: Diagnosis not present

## 2017-12-05 DIAGNOSIS — W57XXXA Bitten or stung by nonvenomous insect and other nonvenomous arthropods, initial encounter: Secondary | ICD-10-CM | POA: Diagnosis not present

## 2017-12-05 DIAGNOSIS — R110 Nausea: Secondary | ICD-10-CM

## 2017-12-05 MED ORDER — ONDANSETRON 4 MG PO TBDP: 4.0000 mg | ORAL_TABLET | Freq: Three times a day (TID) | ORAL | 0 refills | Status: DC | PRN

## 2017-12-05 NOTE — Progress Notes (Signed)
Subjective:   Patient ID: Brett Aguirre, male    DOB: 1986/04/16, 32 y.o.   MRN: [id number] CC: Chills (Started yesterday); Nausea (Recent tick bites); and Diarrhea  HPI: Brett Aguirre is a 32 y.o. male   Felt fine yesterday during the day, he ate at the salad bar at [name] for lunch, he ate everything that his friend ate other than the cottage cheese he had.  He had the same dinner as his wife last night.  He started feeling sick after dinner, mostly queasy, started having chills, aches.  He had a normal bowel movement this morning before so he started having watery diarrhea every 20 min.Marland Kitchen  He has had approximately 10 episodes of far today.  More he moves, the more he has gas he feels he has in his stomach.  He works on a farm, is around hay and callus.  He takes takes off of him self regularly, does not think any of them is been on for longer than 24 hours.  He asks for [facility] spotted fever.  He is noticed a few small red scabbed spots on his abdomen, no other rash.  No rash on hands or feet.  No one else at home is been sick.  He has been able to drink some fluids today.  Has not eaten much.  He feels slightly nauseous now.  He has not thrown up.  Relevant past medical, surgical, family and social history reviewed. Allergies and medications reviewed and updated. Social History   Tobacco Use  Smoking Status Never Smoker  Smokeless Tobacco Never Used   ROS: Per HPI   Objective:    BP 129/86   Pulse 84   Temp (!) 97.5 F (36.4 C) (Oral)   Ht _0  (1.905 m)   Wt 260 lb (117.9 kg)   BMI 32.50 kg/m   Wt Readings from Last 3 Encounters:  12/05/17 260 lb (117.9 kg)  11/16/17 261 lb 3.2 oz (118.5 kg)  07/20/17 264 lb (119.7 kg)    Gen: Uncomfortable appearing, alert, slightly clammy skin, cooperative with exam, NCAT EYES: EOMI, no conjunctival injection, or no icterus ENT:  TMs pearly gray b/l, OP without erythema LYMPH: no cervical LAD CV: NRRR, normal S1/S2, no  murmur, distal pulses 2+ b/l Resp: CTABL, no wheezes, normal WOB Abd: +BS, soft, mildly tender to palpation throughout, ND. no guarding or organomegaly Ext: No edema, warm Neuro: Alert and oriented, strength equal b/l UE and LE, coordination grossly normal MSK: normal muscle bulk Skin: Three 2 mm red papules on trunk, no other rash.  Assessment & Plan:  Brett Aguirre was seen today for chills, nausea and diarrhea. Suspect food poisoning versus gastroenteritis given diarrhea which should start improving soon, now having nausea and upper stomach symptoms as well.  We will check blood work for RMSF given frequent tick bites and current symptoms.  Use Zofran as needed.  Return precautions discussed.  Push fluids.   Diagnoses and all orders for this visit:  Nausea -     ondansetron (ZOFRAN-ODT) 4 MG disintegrating tablet; Take 1 tablet (4 mg total) by mouth every 8 (eight) hours as needed for nausea or vomiting. -     Rocky mtn spotted fvr abs pnl(IgG+IgM) -     CBC with Differential/Platelet -     CMP14+EGFR  Tick bite, initial encounter  Diarrhea, unspecified type -     CBC with Differential/Platelet -     CMP14+EGFR   Follow up plan:  Return if symptoms worsen or fail to improve. Assunta Found, MD [place]

## 2017-12-06 ENCOUNTER — Other Ambulatory Visit: Payer: Self-pay | Admitting: Pediatrics

## 2017-12-06 DIAGNOSIS — R197 Diarrhea, unspecified: Secondary | ICD-10-CM | POA: Diagnosis not present

## 2017-12-06 MED ORDER — METRONIDAZOLE 500 MG PO TABS: 500.0000 mg | ORAL_TABLET | Freq: Three times a day (TID) | ORAL | 0 refills | Status: AC

## 2017-12-06 NOTE — Addendum Note (Signed)
Addended by: Earlene Plater on: 12/06/2017 01:58 PM   Modules accepted: Orders

## 2017-12-06 NOTE — Progress Notes (Signed)
Pt with history of C diff, testing stool.

## 2017-12-07 ENCOUNTER — Telehealth: Payer: Self-pay | Admitting: Nurse Practitioner

## 2017-12-07 LAB — CBC WITH DIFFERENTIAL/PLATELET
Basophils Absolute: 0 10*3/uL (ref 0.0–0.2)
Basos: 0 %
EOS (ABSOLUTE): 0.1 10*3/uL (ref 0.0–0.4)
EOS: 1 %
HEMATOCRIT: 51.2 % — AB (ref 37.5–51.0)
HEMOGLOBIN: 17.2 g/dL (ref 13.0–17.7)
IMMATURE GRANS (ABS): 0 10*3/uL (ref 0.0–0.1)
IMMATURE GRANULOCYTES: 0 %
LYMPHS ABS: 1.4 10*3/uL (ref 0.7–3.1)
Lymphs: 14 %
MCH: 29.7 pg (ref 26.6–33.0)
MCHC: 33.6 g/dL (ref 31.5–35.7)
MCV: 88 fL (ref 79–97)
Monocytes Absolute: 1.1 10*3/uL — ABNORMAL HIGH (ref 0.1–0.9)
Monocytes: 11 %
Neutrophils Absolute: 7.2 10*3/uL — ABNORMAL HIGH (ref 1.4–7.0)
Neutrophils: 74 %
Platelets: 237 10*3/uL (ref 150–450)
RBC: 5.8 x10E6/uL (ref 4.14–5.80)
RDW: 13.9 % (ref 12.3–15.4)
WBC: 9.8 10*3/uL (ref 3.4–10.8)

## 2017-12-07 LAB — CMP14+EGFR
ALBUMIN: 4.5 g/dL (ref 3.5–5.5)
ALT: 54 IU/L — ABNORMAL HIGH (ref 0–44)
AST: 39 IU/L (ref 0–40)
Albumin/Globulin Ratio: 1.5 (ref 1.2–2.2)
Alkaline Phosphatase: 68 IU/L (ref 39–117)
BUN/Creatinine Ratio: 14 (ref 9–20)
BUN: 15 mg/dL (ref 6–20)
Bilirubin Total: 0.4 mg/dL (ref 0.0–1.2)
CALCIUM: 9 mg/dL (ref 8.7–10.2)
CO2: 22 mmol/L (ref 20–29)
CREATININE: 1.08 mg/dL (ref 0.76–1.27)
Chloride: 102 mmol/L (ref 96–106)
GFR, EST AFRICAN AMERICAN: 104 mL/min/{1.73_m2} (ref >59)
GFR, EST NON AFRICAN AMERICAN: 90 mL/min/{1.73_m2} (ref >59)
GLOBULIN, TOTAL: 3 g/dL (ref 1.5–4.5)
GLUCOSE: 106 mg/dL — AB (ref 65–99)
Potassium: 4.4 mmol/L (ref 3.5–5.2)
SODIUM: 139 mmol/L (ref 134–144)
TOTAL PROTEIN: 7.5 g/dL (ref 6.0–8.5)

## 2017-12-07 LAB — ROCKY MTN SPOTTED FVR ABS PNL(IGG+IGM)
RMSF IGG: POSITIVE — AB
RMSF IgM: 0.7 index (ref 0.00–0.89)

## 2017-12-07 LAB — RMSF, IGG, IFA: RMSF, IGG, IFA: <1:64 {titer}

## 2017-12-07 NOTE — Telephone Encounter (Signed)
Pt aware results not back on the stool culture yet.

## 2017-12-12 LAB — CDIFF NAA+O+P+STOOL CULTURE
CDIFFPCR: NEGATIVE
E coli, Shiga toxin Assay: NEGATIVE

## 2017-12-25 DIAGNOSIS — L309 Dermatitis, unspecified: Secondary | ICD-10-CM | POA: Diagnosis not present

## 2017-12-25 DIAGNOSIS — D229 Melanocytic nevi, unspecified: Secondary | ICD-10-CM | POA: Diagnosis not present

## 2017-12-25 DIAGNOSIS — B354 Tinea corporis: Secondary | ICD-10-CM | POA: Diagnosis not present

## 2018-02-04 DIAGNOSIS — L309 Dermatitis, unspecified: Secondary | ICD-10-CM | POA: Diagnosis not present

## 2018-02-04 DIAGNOSIS — B358 Other dermatophytoses: Secondary | ICD-10-CM | POA: Diagnosis not present

## 2018-02-04 DIAGNOSIS — D485 Neoplasm of uncertain behavior of skin: Secondary | ICD-10-CM | POA: Diagnosis not present

## 2018-02-13 DIAGNOSIS — B354 Tinea corporis: Secondary | ICD-10-CM | POA: Diagnosis not present

## 2018-02-13 DIAGNOSIS — Z5181 Encounter for therapeutic drug level monitoring: Secondary | ICD-10-CM | POA: Diagnosis not present

## 2018-02-27 DIAGNOSIS — Z3141 Encounter for fertility testing: Secondary | ICD-10-CM | POA: Diagnosis not present

## 2018-05-28 DIAGNOSIS — D229 Melanocytic nevi, unspecified: Secondary | ICD-10-CM | POA: Diagnosis not present

## 2019-06-02 ENCOUNTER — Other Ambulatory Visit (HOSPITAL_COMMUNITY): Payer: Self-pay | Admitting: Family

## 2019-06-02 ENCOUNTER — Other Ambulatory Visit: Payer: Self-pay | Admitting: Family

## 2019-06-02 DIAGNOSIS — R319 Hematuria, unspecified: Secondary | ICD-10-CM

## 2019-06-02 DIAGNOSIS — R109 Unspecified abdominal pain: Secondary | ICD-10-CM

## 2019-06-03 ENCOUNTER — Other Ambulatory Visit: Payer: Self-pay | Admitting: Family

## 2019-06-03 DIAGNOSIS — R109 Unspecified abdominal pain: Secondary | ICD-10-CM

## 2019-06-03 DIAGNOSIS — R319 Hematuria, unspecified: Secondary | ICD-10-CM

## 2019-06-04 ENCOUNTER — Ambulatory Visit
Admission: RE | Admit: 2019-06-04 | Discharge: 2019-06-04 | Disposition: A | Discharge status: Home / Self Care | Payer: BLUE CROSS/BLUE SHIELD | Source: Ambulatory Visit | Attending: Family | Admitting: Family

## 2019-06-04 ENCOUNTER — Other Ambulatory Visit: Payer: Self-pay

## 2019-06-04 DIAGNOSIS — R109 Unspecified abdominal pain: Secondary | ICD-10-CM

## 2019-06-04 DIAGNOSIS — R319 Hematuria, unspecified: Secondary | ICD-10-CM

## 2019-06-10 ENCOUNTER — Ambulatory Visit (HOSPITAL_COMMUNITY): Payer: Self-pay

## 2019-11-21 ENCOUNTER — Other Ambulatory Visit: Payer: Self-pay | Admitting: Urology

## 2019-11-21 DIAGNOSIS — I861 Scrotal varices: Secondary | ICD-10-CM

## 2019-12-02 ENCOUNTER — Encounter: Payer: Self-pay | Admitting: *Deleted

## 2019-12-02 ENCOUNTER — Ambulatory Visit
Admission: RE | Admit: 2019-12-02 | Discharge: 2019-12-02 | Disposition: A | Discharge status: Home / Self Care | Payer: BLUE CROSS/BLUE SHIELD | Source: Ambulatory Visit | Attending: Urology | Admitting: Urology

## 2019-12-02 DIAGNOSIS — I861 Scrotal varices: Secondary | ICD-10-CM

## 2019-12-02 HISTORY — PX: IR RADIOLOGIST EVAL & MGMT: IMG5224

## 2019-12-02 NOTE — Consult Note (Addendum)
Chief Complaint: Patient was seen in consultation today for left varicocele at the request of Pace,Maryellen D  Referring Physician(s): Pace,Maryellen D  History of Present Illness: Brett Aguirre is a 34 y.o. male with a history of left varicocele has been unable to conceive with his wife for 3 years.  They have undergone 3 IUI cycles which were unsuccessful in establishing pregnancy.  He underwent semen analysis at [facility] with Dr. Kerin Perna that demonstrated decreased sperm concentration, decreased count and abnormal sperm morphology.  Motility was normal.  Brett Aguirre states that he initially detected a varicocele himself by self-examination and feels that he has had a palpable varicocele for quite some time.  The varicocele is associated with occasional dull pain.  He also feels that his left testicle is smaller than his right and has been smaller for quite some time.  No past medical history on file.  Past Surgical History:  Procedure Laterality Date  . APPENDECTOMY    . MOUTH SURGERY      Allergies: Sulfa antibiotics  Medications: Prior to Admission medications   Medication Sig Start Date End Date Taking? Authorizing Provider  clotrimazole-betamethasone (LOTRISONE) lotion Apply topically 2 (two) times daily. 11/16/17   Sharion Balloon, FNP  ondansetron (ZOFRAN-ODT) 4 MG disintegrating tablet Take 1 tablet (4 mg total) by mouth every 8 (eight) hours as needed for nausea or vomiting. 12/05/17   Eustaquio Maize, MD     Family History  Problem Relation Age of Onset  . Healthy Mother   . Hyperlipidemia Father   . Healthy Brother     Social History   Socioeconomic History  . Marital status: Married    Spouse name: Magda Paganini  . Number of children: Not on file  . Years of education: Not on file  . Highest education level: Not on file  Occupational History  . Not on file  Tobacco Use  . Smoking status: Never Smoker  . Smokeless tobacco:  Never Used  Vaping Use  . Vaping Use: Never used  Substance and Sexual Activity  . Alcohol use: Yes    Comment: occasionally  . Drug use: No  . Sexual activity: Not on file  Other Topics Concern  . Not on file  Social History Narrative  . Not on file   Social Determinants of Health   Financial Resource Strain:   . Difficulty of Paying Living Expenses:   Food Insecurity:   . Worried About Charity fundraiser in the Last Year:   . Arboriculturist in the Last Year:   Transportation Needs:   . Film/video editor (Medical):   Marland Kitchen Lack of Transportation (Non-Medical):   Physical Activity:   . Days of Exercise per Week:   . Minutes of Exercise per Session:   Stress:   . Feeling of Stress :   Social Connections:   . Frequency of Communication with Friends and Family:   . Frequency of Social Gatherings with Friends and Family:   . Attends Religious Services:   . Active Member of Clubs or Organizations:   . Attends Archivist Meetings:   Marland Kitchen Marital Status:      Review of Systems: A 12 point ROS discussed and pertinent positives are indicated in the HPI above.  All other systems are negative.  Review of Systems  Constitutional: Negative.   Respiratory: Negative.   Cardiovascular: Negative.   Gastrointestinal: Negative.   Genitourinary: Positive for scrotal swelling. Negative  for difficulty urinating, discharge, dysuria, flank pain, frequency, genital sores, hematuria, penile pain and penile swelling.  Musculoskeletal: Negative.   Neurological: Negative.    Physical Exam: Vital Signs: There were no vitals taken for this visit.  General: No distress. Normal appearance. GU: Penis normal, circumscribed.  3+ palpable left varicocele that distends with Valsalva maneuver. Palpable left testicular atrophy with approximately 25-30% smaller volume compared to the right testicle. No palpable testicular masses or hernias.  Imaging: See below.  Labs:  CBC: No  results for input(s): WBC, HGB, HCT, PLT in the last 8760 hours.  COAGS: No results for input(s): INR, APTT in the last 8760 hours.  BMP: No results for input(s): NA, K, CL, CO2, GLUCOSE, BUN, CALCIUM, CREATININE, GFRNONAA, GFRAA in the last 8760 hours.  Invalid input(s): CMP   Assessment and Plan:  I met with Brett Aguirre and reviewed imaging and exam findings with him.  Ultrasound demonstrates a prominent left-sided varicocele with vein diameter of greater than 5 mm with Valsalva maneuver.  Based on testicular dimensions, the right testicle is 15 mL in volume and the left testicle 11.6 mL for a 23% diminished volume on the left.  There is no evidence of right-sided varicocele.  A prior CT of the abdomen and pelvis without contrast on 06/04/2019 demonstrates no incidental obstructive anatomy such as lymphadenopathy.  The left testicular vein can be followed from its entry into the left renal vein down into the inguinal canal.  There is no variant renal vein anatomy.  I discussed treatment options with Brett Aguirre including surgical varicocele ligation and transcatheter embolization of the left testicular vein to treat left varicocele.  There clearly is an indication for treatment based on the large palpable left-sided varicocele, evidence of testicular atrophy on the left, male infertility and abnormal sperm analysis.  We discussed details of transcatheter embolization including venous access, catheter-based venography and coil embolization of the left testicular vein.    After discussion, Brett Aguirre is interested in pursuing transcatheter embolization to treat the left varicocele.  There is a possibility that he may be traveling by plane in August and we typically recommend not traveling by plane for 1 month after transcatheter embolization to avoid significant pressure changes.  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  Testicular venography with possible testicular vein/varicocele embolization will be scheduled at [facility] on an outpatient basis and be performed with moderate conscious sedation.  There typically is no need for overnight admission.  Thank you for this interesting consult.  I greatly enjoyed meeting Brett Aguirre and look forward to participating in their care.  A copy of this report was sent to the requesting provider on this date.  Electronically Signed: Azzie Roup 12/02/2019, 4:05 PM     I spent a total of 30 Minutes in face to face in clinical consultation, greater than 50% of which was counseling/coordinating care for management of a left varicocele.

## 2019-12-03 ENCOUNTER — Other Ambulatory Visit: Payer: Self-pay | Admitting: Interventional Radiology

## 2019-12-11 ENCOUNTER — Telehealth (HOSPITAL_COMMUNITY): Payer: Self-pay | Admitting: Radiology

## 2019-12-11 NOTE — Telephone Encounter (Signed)
Called pt to schedule Lf varicocele angio/embo with [place]. Pt would like a call back after 01/11/20 to schedule. Cannot do it before at least then. JM

## 2020-02-05 ENCOUNTER — Other Ambulatory Visit (HOSPITAL_COMMUNITY): Payer: Self-pay | Admitting: Interventional Radiology

## 2020-02-05 DIAGNOSIS — I861 Scrotal varices: Secondary | ICD-10-CM

## 2020-03-05 ENCOUNTER — Other Ambulatory Visit: Payer: Self-pay | Admitting: Radiology

## 2020-03-08 ENCOUNTER — Ambulatory Visit (HOSPITAL_COMMUNITY)
Admission: RE | Admit: 2020-03-08 | Discharge: 2020-03-08 | Disposition: A | Discharge status: Home / Self Care | Payer: BC Managed Care – PPO | Source: Ambulatory Visit | Attending: Interventional Radiology | Admitting: Interventional Radiology

## 2020-03-08 ENCOUNTER — Other Ambulatory Visit (HOSPITAL_COMMUNITY): Payer: Self-pay | Admitting: Interventional Radiology

## 2020-03-08 ENCOUNTER — Other Ambulatory Visit: Payer: Self-pay

## 2020-03-08 DIAGNOSIS — Z882 Allergy status to sulfonamides status: Secondary | ICD-10-CM | POA: Diagnosis not present

## 2020-03-08 DIAGNOSIS — I861 Scrotal varices: Secondary | ICD-10-CM | POA: Diagnosis not present

## 2020-03-08 HISTORY — PX: IR VENOGRAM RENAL UNI LEFT: IMG680

## 2020-03-08 HISTORY — PX: IR US GUIDE VASC ACCESS RIGHT: IMG2390

## 2020-03-08 HISTORY — PX: IR EMBO ARTERIAL NOT HEMORR HEMANG INC GUIDE ROADMAPPING: IMG5448

## 2020-03-08 HISTORY — PX: IR ANGIOGRAM SELECTIVE EACH ADDITIONAL VESSEL: IMG667

## 2020-03-08 LAB — CBC
HCT: 47.6 % (ref 39.0–52.0)
Hemoglobin: 16 g/dL (ref 13.0–17.0)
MCH: 29.3 pg (ref 26.0–34.0)
MCHC: 33.6 g/dL (ref 30.0–36.0)
MCV: 87.2 fL (ref 80.0–100.0)
Platelets: 273 10*3/uL (ref 150–400)
RBC: 5.46 MIL/uL (ref 4.22–5.81)
RDW: 12.7 % (ref 11.5–15.5)
WBC: 6.9 10*3/uL (ref 4.0–10.5)
nRBC: 0 % (ref 0.0–0.2)

## 2020-03-08 LAB — PROTIME-INR
INR: 1 (ref 0.8–1.2)
Prothrombin Time: 12.3 seconds (ref 11.4–15.2)

## 2020-03-08 MED ORDER — MIDAZOLAM HCL 2 MG/2ML IJ SOLN
INTRAMUSCULAR | Status: AC | PRN
  Administered 2020-03-08: 1 mg via INTRAVENOUS
  Administered 2020-03-08 (×2): 0.5 mg via INTRAVENOUS

## 2020-03-08 MED ORDER — HYDROCODONE-ACETAMINOPHEN 5-325 MG PO TABS: 1.0000 | ORAL_TABLET | ORAL | Status: DC | PRN

## 2020-03-08 MED ORDER — KETOROLAC TROMETHAMINE 30 MG/ML IJ SOLN
INTRAMUSCULAR | Status: AC
  Filled 2020-03-08: qty 1

## 2020-03-08 MED ORDER — HEPARIN SODIUM (PORCINE) 1000 UNIT/ML IJ SOLN
INTRAMUSCULAR | Status: AC
  Filled 2020-03-08: qty 1

## 2020-03-08 MED ORDER — FENTANYL CITRATE (PF) 100 MCG/2ML IJ SOLN
INTRAMUSCULAR | Status: AC
  Filled 2020-03-08: qty 2

## 2020-03-08 MED ORDER — MIDAZOLAM HCL 2 MG/2ML IJ SOLN
INTRAMUSCULAR | Status: AC
  Filled 2020-03-08: qty 2

## 2020-03-08 MED ORDER — IBUPROFEN 200 MG PO TABS: 600.0000 mg | ORAL_TABLET | Freq: Three times a day (TID) | ORAL | 0 refills | Status: AC

## 2020-03-08 MED ORDER — SODIUM CHLORIDE 0.9 % IV SOLN: INTRAVENOUS | Status: DC

## 2020-03-08 MED ORDER — CEFAZOLIN SODIUM-DEXTROSE 2-4 GM/100ML-% IV SOLN: 2.0000 g | Freq: Once | INTRAVENOUS | Status: AC

## 2020-03-08 MED ORDER — LIDOCAINE HCL (PF) 1 % IJ SOLN
INTRAMUSCULAR | Status: AC
  Filled 2020-03-08: qty 30

## 2020-03-08 MED ORDER — KETOROLAC TROMETHAMINE 30 MG/ML IJ SOLN
INTRAMUSCULAR | Status: AC | PRN
  Administered 2020-03-08: 30 mg via INTRAVENOUS

## 2020-03-08 MED ORDER — IODIXANOL 320 MG/ML IV SOLN
100.0000 mL | Freq: Once | INTRAVENOUS | Status: AC | PRN
  Administered 2020-03-08: 55 mL via INTRAVENOUS

## 2020-03-08 MED ORDER — CEFAZOLIN SODIUM-DEXTROSE 2-4 GM/100ML-% IV SOLN
INTRAVENOUS | Status: AC
  Administered 2020-03-08: 2 g via INTRAVENOUS
  Filled 2020-03-08: qty 100

## 2020-03-08 MED ORDER — FENTANYL CITRATE (PF) 100 MCG/2ML IJ SOLN
INTRAMUSCULAR | Status: AC | PRN
  Administered 2020-03-08 (×2): 25 ug via INTRAVENOUS
  Administered 2020-03-08: 50 ug via INTRAVENOUS

## 2020-03-08 NOTE — Discharge Instructions (Signed)
Moderate Conscious Sedation, Adult, Care After These instructions provide you with information about caring for yourself after your procedure. Your health care provider may also give you more specific instructions. Your treatment has been planned according to current medical practices, but problems sometimes occur. Call your health care provider if you have any problems or questions after your procedure. What can I expect after the procedure? After your procedure, it is common:  To feel sleepy for several hours.  To feel clumsy and have poor balance for several hours.  To have poor judgment for several hours.  To vomit if you eat too soon. Follow these instructions at home: For at least 24 hours after the procedure:   Do not: ? Participate in activities where you could fall or become injured. ? Drive. ? Use heavy machinery. ? Drink alcohol. ? Take sleeping pills or medicines that cause drowsiness. ? Make important decisions or sign legal documents. ? Take care of children on your own.  Rest. Eating and drinking  Follow the diet recommended by your health care provider.  If you vomit: ? Drink water, juice, or soup when you can drink without vomiting. ? Make sure you have little or no nausea before eating solid foods. General instructions  Have a responsible adult stay with you until you are awake and alert.  Take over-the-counter and prescription medicines only as told by your health care provider.  If you smoke, do not smoke without supervision.  Keep all follow-up visits as told by your health care provider. This is important. Contact a health care provider if:  You keep feeling nauseous or you keep vomiting.  You feel light-headed.  You develop a rash.  You have a fever. Get help right away if:  You have trouble breathing. This information is not intended to replace advice given to you by your health care provider. Make sure you discuss any questions you have  with your health care provider. Document Revised: 04/27/2017 Document Reviewed: 09/04/2015 Elsevier Patient Education  [place]. Embolization, Care After This sheet gives you information about how to care for yourself after your procedure. Your health care provider may also give you more specific instructions. If you have problems or questions, contact your health care provider. What can I expect after the procedure? After the procedure, it is common to have pain or bruising at the area where your catheter was inserted (insertion site). You may have other symptoms depending on the part of your body that was treated. For example:  Embolization of the arteries in the brain may cause a headache.  Embolization of the arteries in the stomach may cause loss of appetite or nausea. Follow these instructions at home: Insertion site care   If the site starts to bleed, lie down flat and put pressure on the site. If the bleeding does not stop, get help right away. This is a medical emergency.  Follow instructions from your health care provider about how to take care of your insertion site. Make sure you: ? Wash your hands with soap and water before and after you change your bandage (dressing). If soap and water are not available, use hand sanitizer. ? Change your dressing as told by your health care provider. ? Leave stitches (sutures), skin glue, or adhesive strips in place. These skin closures may need to stay in place for 2 weeks or longer. If adhesive strip edges start to loosen and curl up, you may trim the loose edges. Do not remove  adhesive strips completely unless your health care provider tells you to do that.  Keep the insertion site clean. To do this: ? Gently wash the site with plain soap and water. ? Pat the area dry with a clean towel. ? Do not rub the site. This may cause bleeding.  Check your insertion site every day for signs of infection. Check for: ? Redness, swelling,  or pain. ? Fluid or blood. ? Warmth. ? Pus or a bad smell.  Do not take baths, swim, or use a hot tub until your health care provider approves. You may be allowed to shower 24-48 hours after the procedure. Activity   Do not lift anything that is heavier than 10 lb (4.5 kg), or the limit that you are told, until your health care provider says that it is safe.  Do not drive for 24 hours if you were given a sedative during your procedure.  Return to your normal activities as told by your health care provider. Ask your health care provider what activities are safe for you. General instructions   Take over-the-counter and prescription medicines only as told by your health care provider.  Ask your health care provider if the medicine prescribed to you: ? Requires you to avoid driving or using heavy machinery. ? Can cause constipation. You may need to take actions to prevent or treat constipation, such as:  Drink enough fluid to keep your urine pale yellow.  Take over-the-counter or prescription medicines.  Eat foods that are high in fiber, such as beans, whole grains, and fresh fruits and vegetables.  Limit foods that are high in fat and processed sugars, such as fried or sweet foods.  Keep all follow-up visits as told by your health care provider. This is important. Contact a health care provider if:  You have pain that gets worse or does not get better with medicine.  You have a fever.  You have redness, swelling, or pain around your insertion site.  You have fluid or blood coming from your insertion site.  Your insertion site is warm to the touch.  You have pus or a bad smell coming from your insertion site.  You have nausea or vomiting. Get help right away if:  The insertion area swells very quickly.  The insertion area is bleeding, and the bleeding does not stop after you hold steady pressure on the area.  The area near or just beyond the insertion site becomes  pale, cool, tingly, or numb.  You faint or feel like you might faint.  You have chest pain.  You have trouble breathing.  You feel weak or have trouble moving your arms or legs.  You have problems with balance, speech, or vision. These symptoms may represent a serious problem that is an emergency. Do not wait to see if the symptoms will go away. Get medical help right away. Call your local emergency services (911 in the [place] Do not drive yourself to the hospital. Summary  After your procedure, it is common to have pain or bruising at the area where your catheter was inserted (insertion site).  Do not drive for 24 hours if you were given a sedative during your procedure.  Follow instructions from your health care provider about how to take care of your insertion site.  Contact a health care provider if you have a fever or if you have redness, swelling, or pain around your insertion site.  Keep all follow-up visits as told by your health  care provider. This is important. This information is not intended to replace advice given to you by your health care provider. Make sure you discuss any questions you have with your health care provider. Document Revised: 10/02/2018 Document Reviewed: 01/14/2018 Elsevier Patient Education  [place].

## 2020-03-08 NOTE — H&P (Signed)
Chief Complaint: Patient was seen in consultation today for testicular venography with possible testicular vein/variocele embolization.  Referring Physician(s): Jacalyn Lefevre  Supervising Physician: Aletta Edouard  Patient Status: [facility] - Out-pt  History of Present Illness: Brett Aguirre is a 34 y.o. male with a past medical history significant for left varicocele who presents today for image guided testicular venography with possible testicular vein/varicocele embolization with moderate sedation. Brett Aguirre was see in consultation with Dr. Kathlene Cote on 12/02/19 - please see this consult note for full details. Briefly, Brett Aguirre and his wife have had difficulty conceiving x 3 years and he underwent a semen analysis which noted decreased sperm concentration, decreased count and abnormal sperm morphology with normal motility. He had a known palpable left varicocele as well. Based on these findings as well as previous imaging findings showing testicular atrophy he was referred to IR for possible treatment. After thorough discussion with Dr. Kathlene Cote he has elected to proceed today.  [name] denies any complaints today except that he is hungry. He understands the procedure today and is ready to proceed as planned.  No past medical history on file.  Past Surgical History:  Procedure Laterality Date  . APPENDECTOMY    . IR RADIOLOGIST EVAL & MGMT  12/02/2019  . MOUTH SURGERY      Allergies: Sulfa antibiotics  Medications: Prior to Admission medications   Medication Sig Start Date End Date Taking? Authorizing Provider  lansoprazole (PREVACID) 15 MG capsule Take 15 mg by mouth daily as needed (acid reflux).   Yes [provider]     Family History  Problem Relation Age of Onset  . Healthy Mother   . Hyperlipidemia Father   . Healthy Brother     Social History   Socioeconomic History  . Marital status: Married    Spouse name: Magda Paganini  . Number of children: Not  on file  . Years of education: Not on file  . Highest education level: Not on file  Occupational History  . Not on file  Tobacco Use  . Smoking status: Never Smoker  . Smokeless tobacco: Never Used  Vaping Use  . Vaping Use: Never used  Substance and Sexual Activity  . Alcohol use: Yes    Comment: occasionally  . Drug use: No  . Sexual activity: Not on file  Other Topics Concern  . Not on file  Social History Narrative  . Not on file   Social Determinants of Health   Financial Resource Strain:   . Difficulty of Paying Living Expenses: Not on file  Food Insecurity:   . Worried About Charity fundraiser in the Last Year: Not on file  . Ran Out of Food in the Last Year: Not on file  Transportation Needs:   . Lack of Transportation (Medical): Not on file  . Lack of Transportation (Non-Medical): Not on file  Physical Activity:   . Days of Exercise per Week: Not on file  . Minutes of Exercise per Session: Not on file  Stress:   . Feeling of Stress : Not on file  Social Connections:   . Frequency of Communication with Friends and Family: Not on file  . Frequency of Social Gatherings with Friends and Family: Not on file  . Attends Religious Services: Not on file  . Active Member of Clubs or Organizations: Not on file  . Attends Archivist Meetings: Not on file  . Marital Status: Not on file  Review of Systems: A 12 point ROS discussed and pertinent positives are indicated in the HPI above.  All other systems are negative.  Review of Systems  Constitutional: Negative for chills and fever.  Respiratory: Negative for cough and shortness of breath.   Cardiovascular: Negative for chest pain.  Gastrointestinal: Negative for abdominal pain, diarrhea, nausea and vomiting.  Genitourinary: Negative for dysuria and hematuria.  Musculoskeletal: Negative for back pain.  Neurological: Negative for dizziness and headaches.    Vital Signs: BP 125/73   Pulse (!) 55    Temp 97.9 F (36.6 C) (Oral)   Resp 16   Ht 6\' 3"  (1.905 m)   Wt 265 lb (120.2 kg)   SpO2 99%   BMI 33.12 kg/m   Physical Exam Vitals reviewed.  Constitutional:      General: He is not in acute distress. HENT:     Head: Normocephalic.     Mouth/Throat:     Mouth: Mucous membranes are moist.     Pharynx: Oropharynx is clear. No oropharyngeal exudate or posterior oropharyngeal erythema.  Cardiovascular:     Rate and Rhythm: Normal rate and regular rhythm.  Pulmonary:     Effort: Pulmonary effort is normal.     Breath sounds: Normal breath sounds.  Abdominal:     General: There is no distension.     Palpations: Abdomen is soft.     Tenderness: There is no abdominal tenderness.  Skin:    General: Skin is warm and dry.  Neurological:     Mental Status: He is alert and oriented to person, place, and time.  Psychiatric:        Mood and Affect: Mood normal.        Behavior: Behavior normal.        Thought Content: Thought content normal.        Judgment: Judgment normal.      MD Evaluation Airway: WNL Heart: WNL Abdomen: WNL Chest/ Lungs: WNL Brett Aguirre   Imaging: No results found.  Labs:  CBC: Recent Labs    03/08/20 0918  WBC 6.9  HGB 16.0  HCT 47.6  PLT 273    COAGS: Recent Labs    03/08/20 0918  INR 1.0    BMP: No results for input(s): NA, K, CL, CO2, GLUCOSE, BUN, CALCIUM, CREATININE, GFRNONAA, GFRAA in the last 8760 hours.  Invalid input(s): CMP  LIVER FUNCTION TESTS: No results for input(s): BILITOT, AST, ALT, ALKPHOS, PROT, ALBUMIN in the last 8760 hours.  TUMOR MARKERS: No results for input(s): AFPTM, CEA, CA199, CHROMGRNA in the last 8760 hours.  Assessment and Plan:  34 y/o M with history of left varicocele, testicular atrophy on the left, male infertility and abnormal sperm analysis who presents today for an image guided testicular venography with possible testicular vein/varicocele  embolization.  Patient has been NPO since 830 pm yesterday, no current anticoagulation/antiplatelet medications. Afebrile, WBC 6.9, hgb 16.0, plt 273, INR 1.0.  Risks and benefits of testicular venogram with intervention were discussed with the patient including, but not limited to bleeding, infection, vascular injury, contrast induced renal failure, stroke, reperfusion hemorrhage, or even death.  This interventional procedure involves the use of X-rays and because of the nature of the planned procedure, it is possible that we will have prolonged use of X-ray fluoroscopy.  Potential radiation risks to you include (but are not limited to) the following: - A slightly elevated risk for cancer  several years later in  life. This risk is typically less than 0.5% percent. This risk is low in comparison to the normal incidence of human cancer, which is 33% for women and 50% for men according to the [place]. - Radiation induced injury can include skin redness, resembling a rash, tissue breakdown / ulcers and hair loss (which can be temporary or permanent).  The likelihood of either of these occurring depends on the difficulty of the procedure and whether you are sensitive to radiation due to previous procedures, disease, or genetic conditions.  IF your procedure requires a prolonged use of radiation, you will be notified and given written instructions for further action.  It is your responsibility to monitor the irradiated area for the 2 weeks following the procedure and to notify your physician if you are concerned that you have suffered a radiation induced injury.    All of the patient's questions were answered, patient is agreeable to proceed.  Consent signed and in chart.  Thank you for this interesting consult.  I greatly enjoyed meeting SAHEED CARRINGTON and look forward to participating in their care.  A copy of this report was sent to the requesting provider on this  date.  Electronically Signed: Joaquim Nam, PA-C 03/08/2020, 9:05 AM   I spent a total of   25 Minutes in face to face in clinical consultation, greater than 50% of which was counseling/coordinating care for testicular venogram with possible intervention.

## 2020-03-08 NOTE — Procedures (Signed)
Interventional Radiology Procedure Note  Procedure: Left testicular venography and embolization  Access: Right IJ vein  Complications: None  Estimated Blood Loss: < 10 mL  Findings: Single left testicular vein with prominent reflux down to scrotum with Valsalva maneuver.  Left gonadal vein embolization with Ruby coils beginning below inguinal ligament. Good occlusion achieved.  Venetia Night. Kathlene Cote, M.D Pager:  [phone]

## 2020-04-28 ENCOUNTER — Ambulatory Visit
Admission: RE | Admit: 2020-04-28 | Discharge: 2020-04-28 | Disposition: A | Discharge status: Home / Self Care | Payer: BC Managed Care – PPO | Source: Ambulatory Visit | Attending: Physician Assistant | Admitting: Physician Assistant

## 2020-04-28 ENCOUNTER — Encounter: Payer: Self-pay | Admitting: *Deleted

## 2020-04-28 ENCOUNTER — Other Ambulatory Visit: Payer: Self-pay

## 2020-04-28 DIAGNOSIS — I861 Scrotal varices: Secondary | ICD-10-CM

## 2020-04-28 HISTORY — PX: IR RADIOLOGIST EVAL & MGMT: IMG5224

## 2020-04-28 NOTE — Progress Notes (Signed)
Chief Complaint: Patient was consulted remotely today (TeleHealth) for follow-up after a left sided varicocele on 03/08/2020.  History of Present Illness: Brett Aguirre is a 34 y.o. male with a history of left varicocele, left testicular atrophy, male infertility and abnormal sperm analysis who underwent left testicular venography on 03/08/2020 confirming an incompetent left testicular vein with prominent retrograde flow supplying a left-sided varicocele.  The varicocele and left testicular vein were embolized successfully with embolization coils.  After the procedure Brett Aguirre states that he had some mild discomfort for about a week.  He has no current pain or other symptoms.  He feels that the left varicocele has decompressed after embolization and is roughly 50% diminished in size by palpation compared to prior to embolization.  He is planning to pursue another sperm analysis and approximately 2 to 4 months.  No past medical history on file.  Past Surgical History:  Procedure Laterality Date  . APPENDECTOMY    . IR ANGIOGRAM SELECTIVE EACH ADDITIONAL VESSEL  03/08/2020  . IR EMBO ARTERIAL NOT HEMORR HEMANG INC GUIDE ROADMAPPING  03/08/2020  . IR RADIOLOGIST EVAL & MGMT  12/02/2019  . IR RADIOLOGIST EVAL & MGMT  04/28/2020  . IR US GUIDE VASC ACCESS RIGHT  03/08/2020  . IR VENOGRAM RENAL UNI LEFT  03/08/2020  . MOUTH SURGERY      Allergies: Sulfa antibiotics  Medications: Prior to Admission medications   Medication Sig Start Date End Date Taking? Authorizing Provider  lansoprazole (PREVACID) 15 MG capsule Take 15 mg by mouth daily as needed (acid reflux).    [provider]     Family History  Problem Relation Age of Onset  . Healthy Mother   . Hyperlipidemia Father   . Healthy Brother     Social History   Socioeconomic History  . Marital status: Married    Spouse name: Magda Paganini  . Number of children: Not on file  . Years of education: Not on file  .  Highest education level: Not on file  Occupational History  . Not on file  Tobacco Use  . Smoking status: Never Smoker  . Smokeless tobacco: Never Used  Vaping Use  . Vaping Use: Never used  Substance and Sexual Activity  . Alcohol use: Yes    Comment: occasionally  . Drug use: No  . Sexual activity: Not on file  Other Topics Concern  . Not on file  Social History Narrative  . Not on file   Social Determinants of Health   Financial Resource Strain:   . Difficulty of Paying Living Expenses: Not on file  Food Insecurity:   . Worried About Charity fundraiser in the Last Year: Not on file  . Ran Out of Food in the Last Year: Not on file  Transportation Needs:   . Lack of Transportation (Medical): Not on file  . Lack of Transportation (Non-Medical): Not on file  Physical Activity:   . Days of Exercise per Week: Not on file  . Minutes of Exercise per Session: Not on file  Stress:   . Feeling of Stress : Not on file  Social Connections:   . Frequency of Communication with Friends and Family: Not on file  . Frequency of Social Gatherings with Friends and Family: Not on file  . Attends Religious Services: Not on file  . Active Member of Clubs or Organizations: Not on file  . Attends Archivist Meetings: Not on file  . Marital  Status: Not on file    Review of Systems  Constitutional: Negative.   Respiratory: Negative.   Cardiovascular: Negative.   Gastrointestinal: Negative.   Genitourinary: Negative.   Musculoskeletal: Negative.   Neurological: Negative.     Review of Systems: A 12 point ROS discussed and pertinent positives are indicated in the HPI above.  All other systems are negative.  Physical Exam No direct physical exam was performed (except for noted visual exam findings with Video Visits).   Vital Signs: There were no vitals taken for this visit.  Imaging: IR Radiologist Eval & Mgmt  Result Date: 04/28/2020 Please refer to notes tab for  details about interventional procedure. (Op Note)   Labs:  CBC: Recent Labs    03/08/20 0918  WBC 6.9  HGB 16.0  HCT 47.6  PLT 273    COAGS: Recent Labs    03/08/20 0918  INR 1.0    Assessment and Plan:  Brett Aguirre is doing well after embolization of a left testicular varicocele with noticeable decrease in size of the varicocele to palpation.  He has completely recovered with no current symptoms following embolization.  He and his wife will continue to try to have a child and are planning on repeat sperm analysis in roughly 2 to 4 months.  I told Brett Aguirre that I would like a copy of the analysis for our records once that is performed.  Electronically Signed: Azzie Roup 04/28/2020, 4:04 PM     I spent a total of 10 Minutes in remote  clinical consultation, greater than 50% of which was counseling/coordinating care post embolization of a left testicular varicocele.    Visit type: Audio only (telephone). Audio (no video) only due to patient's lack of internet/smartphone capability. Alternative for in-person consultation at [facility], [address] [place], [place], [place]. This visit type was conducted due to national recommendations for restrictions regarding the COVID-19 Pandemic (e.g. social distancing).  This format is felt to be most appropriate for this patient at this time.  All issues noted in this document were discussed and addressed.

## 2020-06-23 IMAGING — CT CT ABD-PELV W/O CM
1 of 2 series · 13 of 32 positions shown, 18 images · non-contrast
Comparison: None.

CLINICAL DATA: Right-sided flank pain with hematuria for the past 6
weeks. History of appendectomy.

EXAM:
CT ABDOMEN AND PELVIS WITHOUT CONTRAST
TECHNIQUE: Multidetector CT imaging of the abdomen and pelvis was performed
following the standard protocol without IV contrast.

[Series 2: renal standard/full · axial · 0.81mm/px · z∈[-439,+41]mm · 13 of 108 slices shown, 18 images]
[im 6/108  soft-tissue]
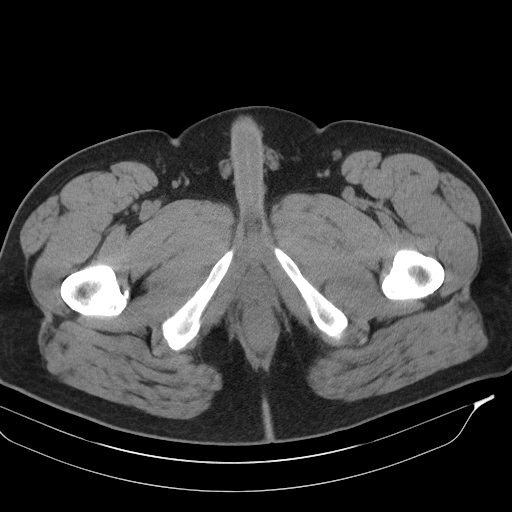
[im 6/108  bone]
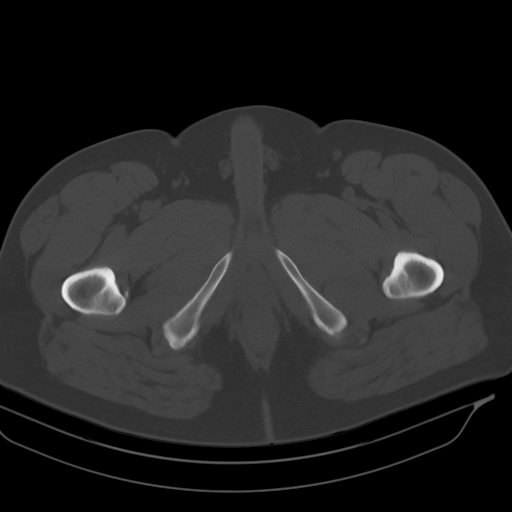
[im 18/108  soft-tissue]
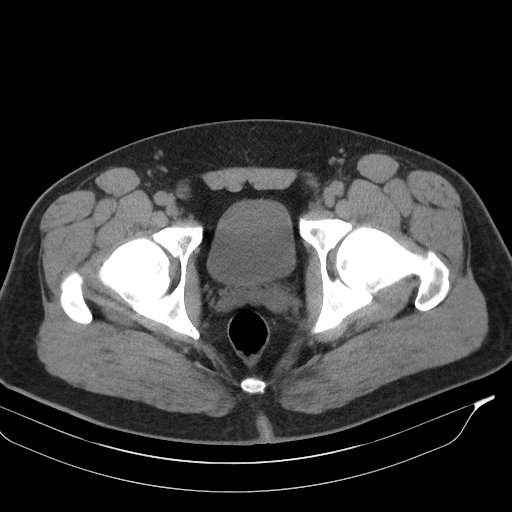
[im 24/108  soft-tissue]
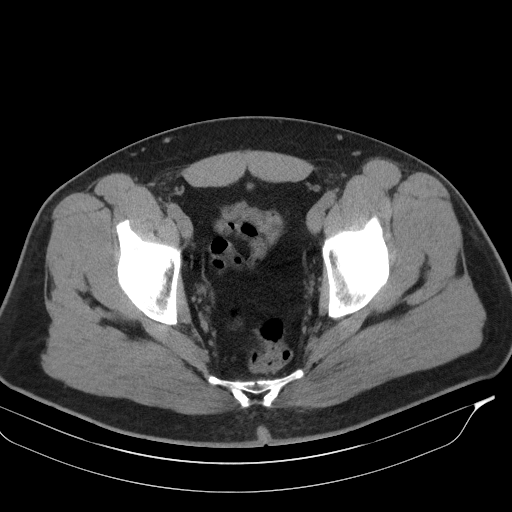
[im 30/108  soft-tissue]
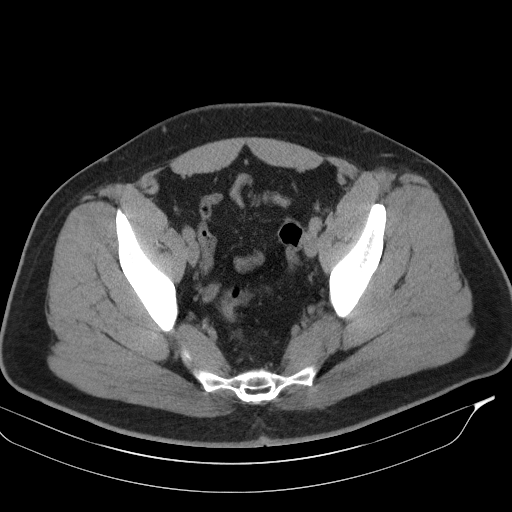
[im 42/108  soft-tissue]
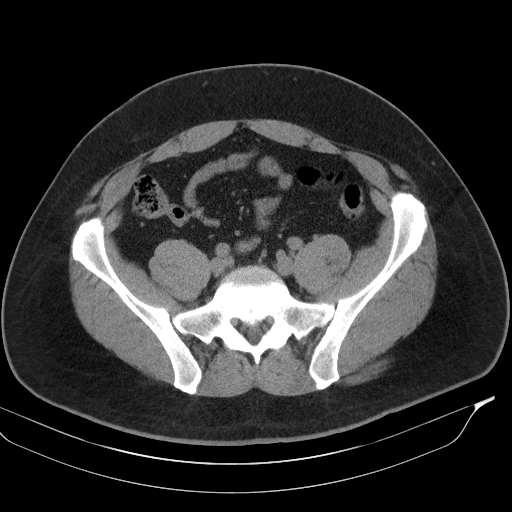
[im 48/108  soft-tissue]
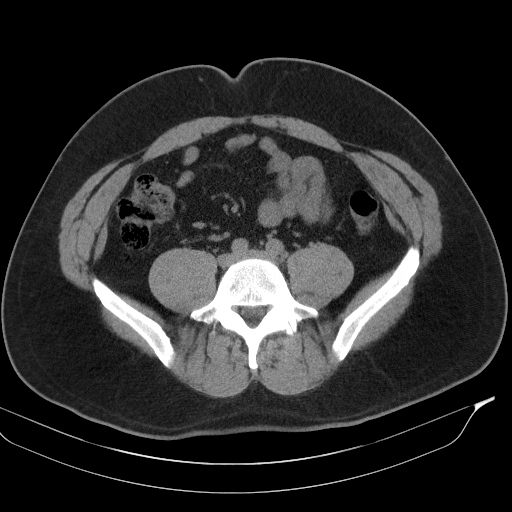
[im 60/108  soft-tissue]
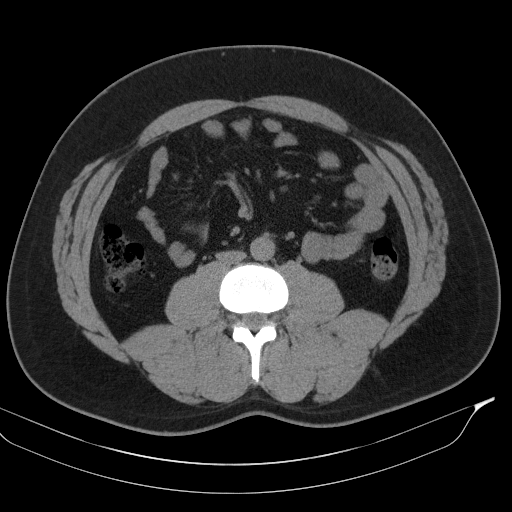
[im 66/108  soft-tissue]
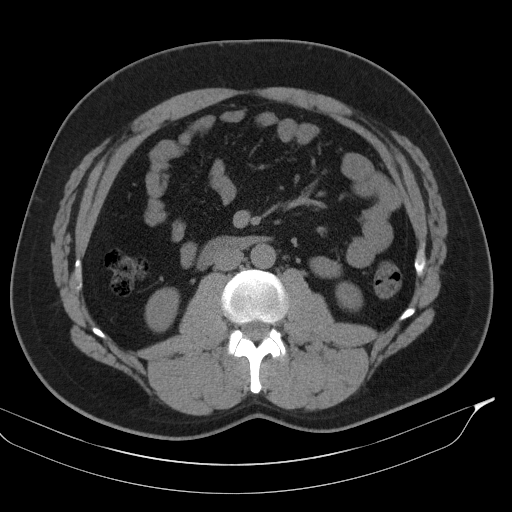
[im 78/108  soft-tissue]
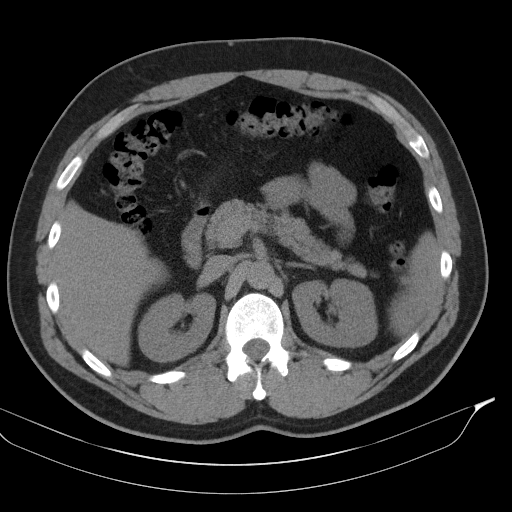
[im 78/108  bone]
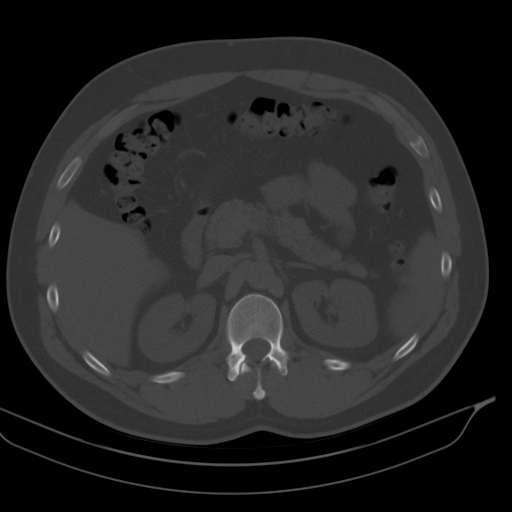
[im 84/108  soft-tissue]
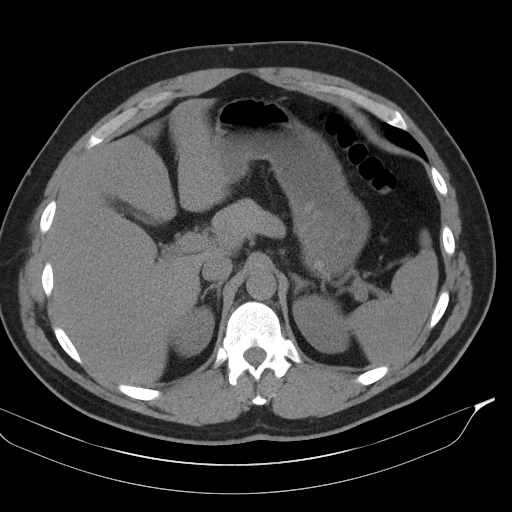
[im 84/108  lung]
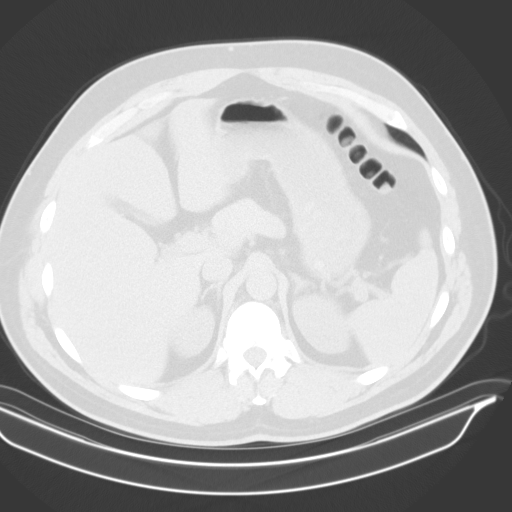
[im 90/108  soft-tissue]
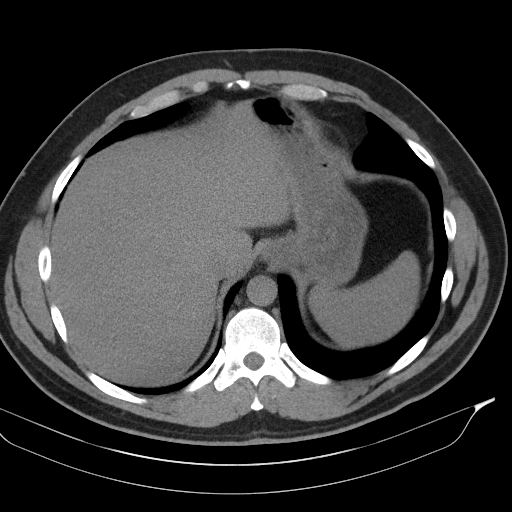
[im 90/108  lung]
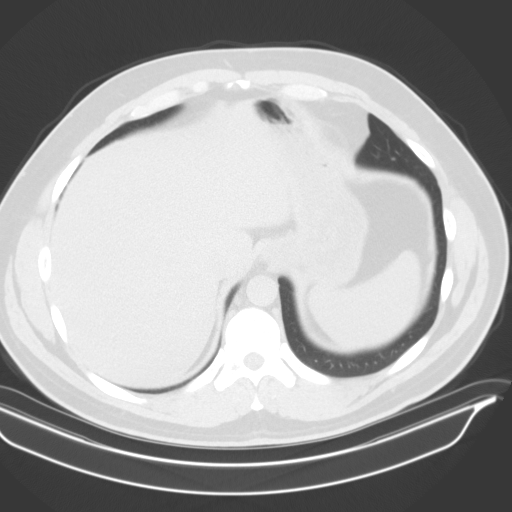
[im 96/108  lung]
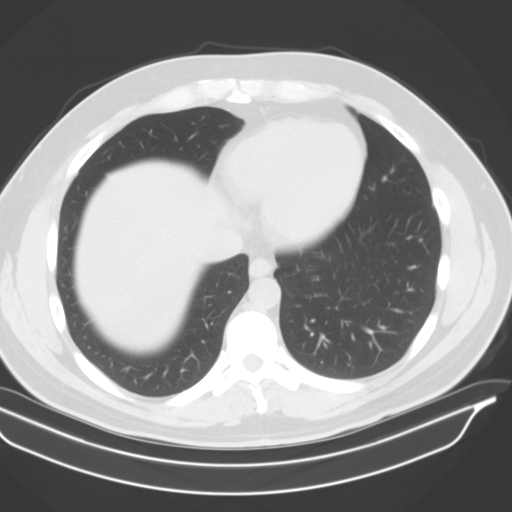
[im 102/108  soft-tissue]
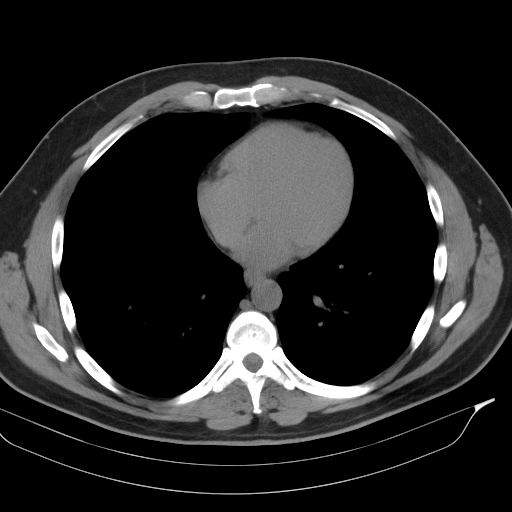
[im 102/108  lung]
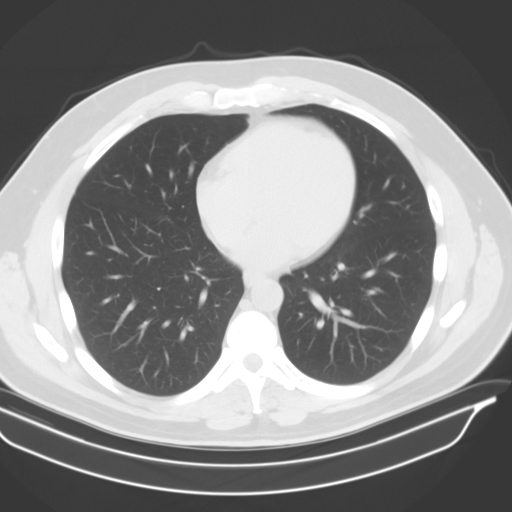

[13 of 32 positions shown; findings below may reference images not displayed]

FINDINGS: The lack of intravenous contrast limits the ability to evaluate
solid abdominal organs.

Lower chest: Limited visualization of the lower thorax is negative
for focal airspace opacity or pleural effusion.

Normal heart size.  No pericardial effusion.

Hepatobiliary: There is mild diffuse decreased attenuation of the
hepatic parenchyma. Normal hepatic contour. There is a punctate
(subcentimeter) hypoattenuating lesion within the right lobe of the
liver (image 23, series 2), too small to accurately characterize
though favored to represent a hepatic cyst. Normal noncontrast
appearance of gallbladder given underdistention. No radiopaque
gallstones. No ascites.

Pancreas: Normal noncontrast appearance of the pancreas.

Spleen: Normal noncontrast appearance of the spleen.

Adrenals/Urinary Tract: Normal noncontrast appearance of the
bilateral kidneys. No renal stones. No renal stones are seen along
the expected course of either ureter or the urinary bladder. No
urinary obstruction or perinephric stranding. Several phleboliths
are seen with the lower pelvis bilaterally.

Normal noncontrast appearance the bilateral adrenal glands.

Normal noncontrast appearance of the urinary bladder given degree
distention.

Stomach/Bowel: Moderate colonic stool burden without evidence of
enteric obstruction. Normal noncontrast appearance of the terminal
ileum. The appendix is not visualized compatible with provided
operative history. No significant hiatal hernia. No
pneumoperitoneum, pneumatosis or portal venous gas.

Vascular/Lymphatic: Normal caliber of the abdominal aorta.

No bulky retroperitoneal, mesenteric, pelvic or inguinal
lymphadenopathy.

Reproductive: Dystrophic calcifications within normal sized prostate
gland. No free fluid within the pelvic cul-de-sac.

Other: Tiny mesenteric fat containing periumbilical hernia.

Musculoskeletal: No acute or aggressive osseous abnormalities bone
islands are noted within the right ilium and femoral head.
IMPRESSION: 1. No explanation for patient's right-sided flank pain.
Specifically, no evidence of nephrolithiasis, urinary or enteric
obstruction.
2. Mild diffuse decreased attenuation of the hepatic parenchyma as
could be seen in the setting of hepatic steatosis. Correlation with
LFTs is advised.
